# Patient Record
Sex: Female | Born: 1985
Health system: Southern US, Community
[De-identification: ages and names within clinical notes are randomized; demographics above are authoritative.]

## PROBLEM LIST (undated history)

## (undated) DIAGNOSIS — G43909 Migraine, unspecified, not intractable, without status migrainosus: Secondary | ICD-10-CM

## (undated) HISTORY — DX: Migraine, unspecified, not intractable, without status migrainosus: G43.909

## (undated) HISTORY — PX: WISDOM TOOTH EXTRACTION: SHX21

---
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
## 2010-01-25 ENCOUNTER — Inpatient Hospital Stay (HOSPITAL_COMMUNITY): Admission: AD | Admit: 2010-01-25 | Discharge: 2010-01-26 | Payer: Self-pay | Admitting: Obstetrics and Gynecology

## 2010-09-27 ENCOUNTER — Inpatient Hospital Stay (HOSPITAL_COMMUNITY): Admission: AD | Admit: 2010-09-27 | Discharge: 2010-09-30 | Payer: Self-pay | Admitting: Obstetrics and Gynecology

## 2011-01-28 LAB — CBC
HCT: 27.5 % — ABNORMAL LOW (ref 36.0–46.0)
HCT: 36.2 % (ref 36.0–46.0)
Hemoglobin: 12.3 g/dL (ref 12.0–15.0)
Hemoglobin: 9.5 g/dL — ABNORMAL LOW (ref 12.0–15.0)
MCH: 31.9 pg (ref 26.0–34.0)
MCH: 32.4 pg (ref 26.0–34.0)
MCHC: 33.9 g/dL (ref 30.0–36.0)
MCHC: 34.6 g/dL (ref 30.0–36.0)
MCV: 93.6 fL (ref 78.0–100.0)
MCV: 94 fL (ref 78.0–100.0)
Platelets: 165 10*3/uL (ref 150–400)
Platelets: 216 10*3/uL (ref 150–400)
RBC: 2.93 MIL/uL — ABNORMAL LOW (ref 3.87–5.11)
RBC: 3.85 MIL/uL — ABNORMAL LOW (ref 3.87–5.11)
RDW: 13.6 % (ref 11.5–15.5)
RDW: 14.1 % (ref 11.5–15.5)
WBC: 16.1 10*3/uL — ABNORMAL HIGH (ref 4.0–10.5)
WBC: 18.7 10*3/uL — ABNORMAL HIGH (ref 4.0–10.5)

## 2011-01-28 LAB — RPR: RPR Ser Ql: NONREACTIVE

## 2011-02-10 LAB — URINALYSIS, ROUTINE W REFLEX MICROSCOPIC
Glucose, UA: NEGATIVE mg/dL
Hgb urine dipstick: NEGATIVE
Ketones, ur: 15 mg/dL — AB
Protein, ur: NEGATIVE mg/dL

## 2011-07-02 ENCOUNTER — Ambulatory Visit (INDEPENDENT_AMBULATORY_CARE_PROVIDER_SITE_OTHER): Payer: 59

## 2011-07-02 ENCOUNTER — Inpatient Hospital Stay (INDEPENDENT_AMBULATORY_CARE_PROVIDER_SITE_OTHER)
Admission: RE | Admit: 2011-07-02 | Discharge: 2011-07-02 | Disposition: A | Discharge status: Home / Self Care | Payer: 59 | Source: Ambulatory Visit | Attending: Family Medicine | Admitting: Family Medicine

## 2011-07-02 DIAGNOSIS — J019 Acute sinusitis, unspecified: Secondary | ICD-10-CM

## 2011-09-01 ENCOUNTER — Inpatient Hospital Stay (INDEPENDENT_AMBULATORY_CARE_PROVIDER_SITE_OTHER)
Admission: RE | Admit: 2011-09-01 | Discharge: 2011-09-01 | Disposition: A | Discharge status: Home / Self Care | Payer: 59 | Source: Ambulatory Visit | Attending: Family Medicine | Admitting: Family Medicine

## 2011-09-01 DIAGNOSIS — J019 Acute sinusitis, unspecified: Secondary | ICD-10-CM

## 2011-11-13 IMAGING — US US OB COMP LESS 14 WK
1 series · 13 of 28 positions shown · non-contrast
Comparison: None

Clinical  Data: 24-year-old pregnant female with left pelvic  pain.
Estimated gestational age of 5 weeks 4 days by LMP.

OBSTETRIC <14 WK US AND TRANSVAGINAL OB US
TECHNIQUE: Both transabdominal and transvaginal ultrasound
examinations were performed for complete evaluation of the
gestation as well as the maternal uterus, adnexal regions, and
pelvic cul-de-sac.

[Series 1: us ob comp less 14 wks · 0.21mm/px · 13 of 35 slices shown]
[im 2/35]
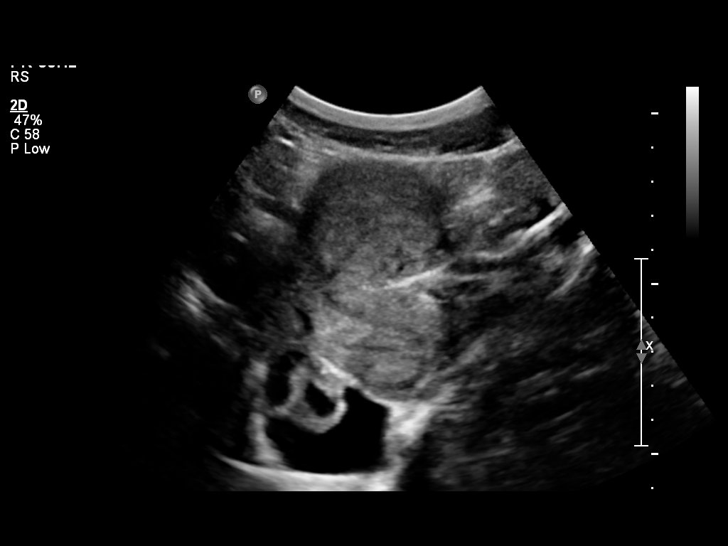
[im 4/35]
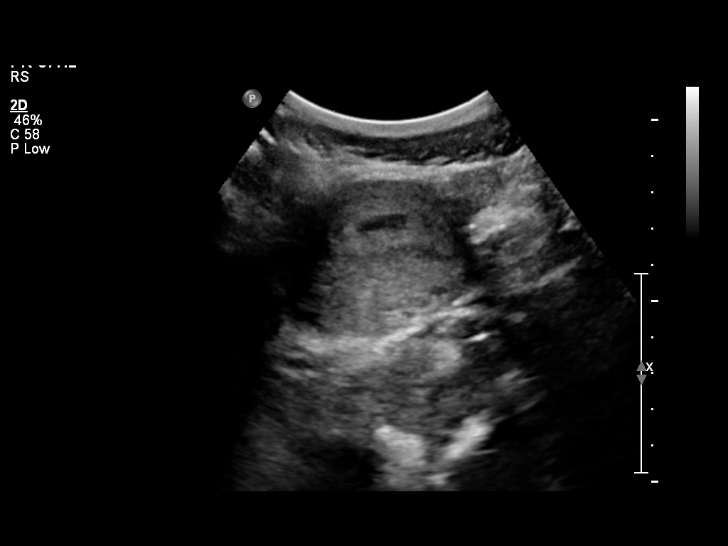
[im 7/35]
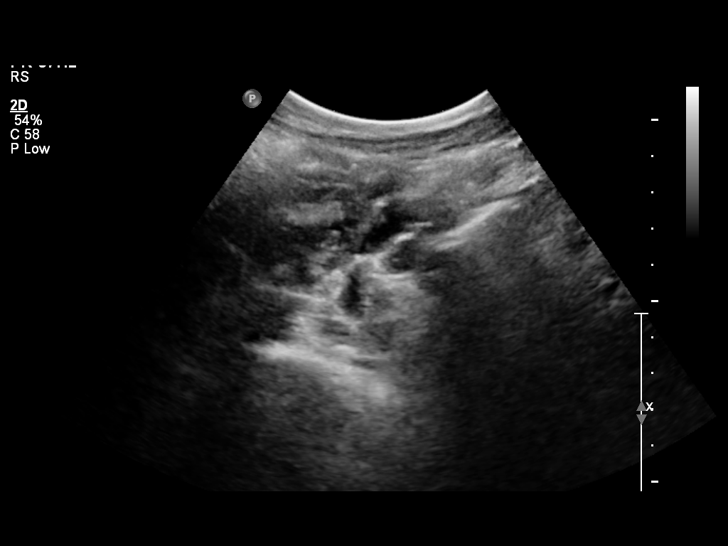
[im 9/35]
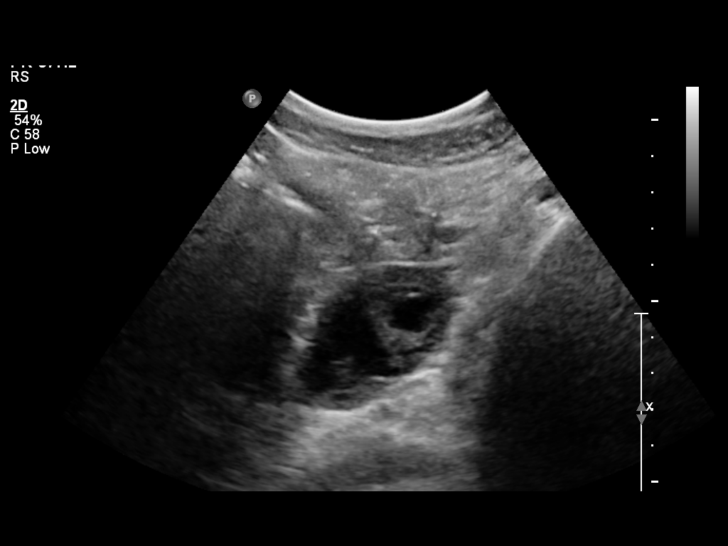
[im 12/35]
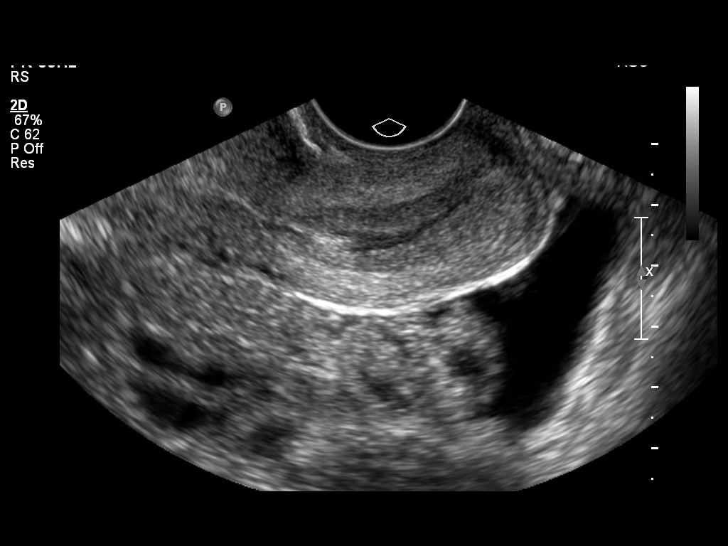
[im 14/35]
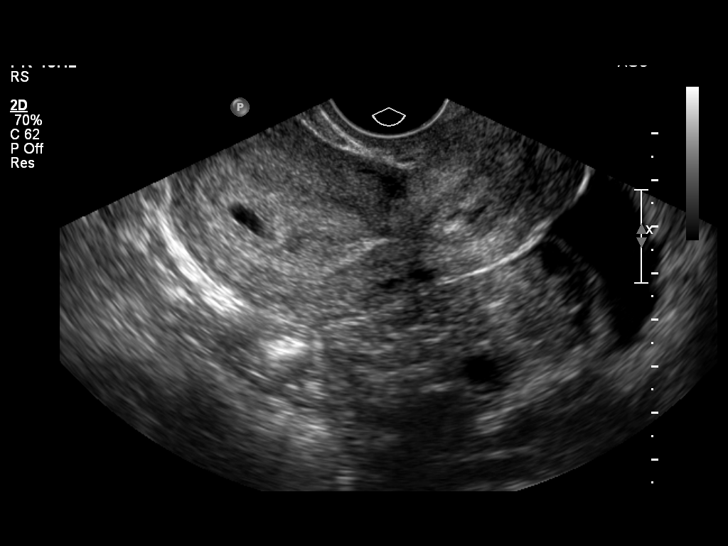
[im 18/35]
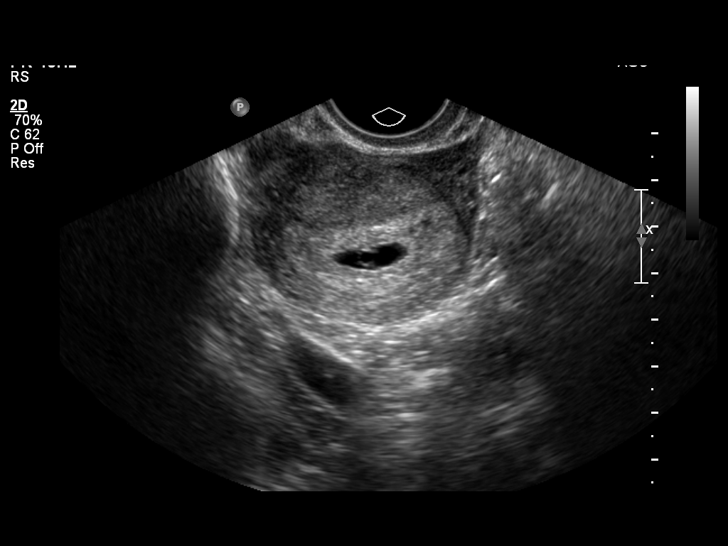
[im 21/35]
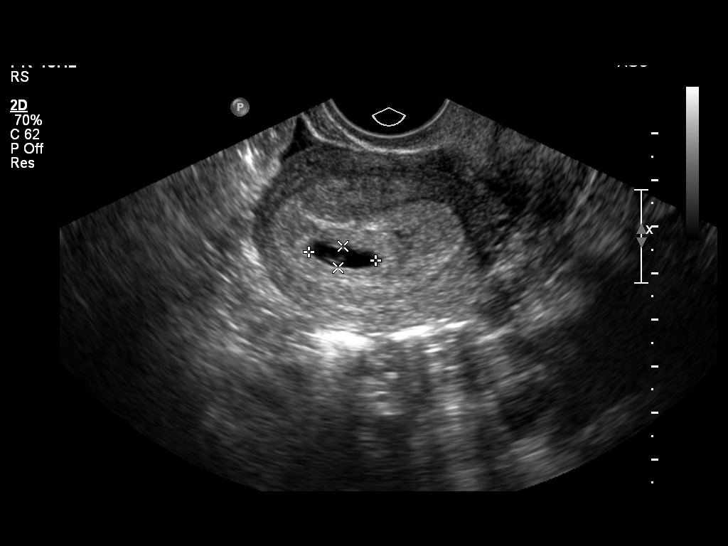
[im 23/35]
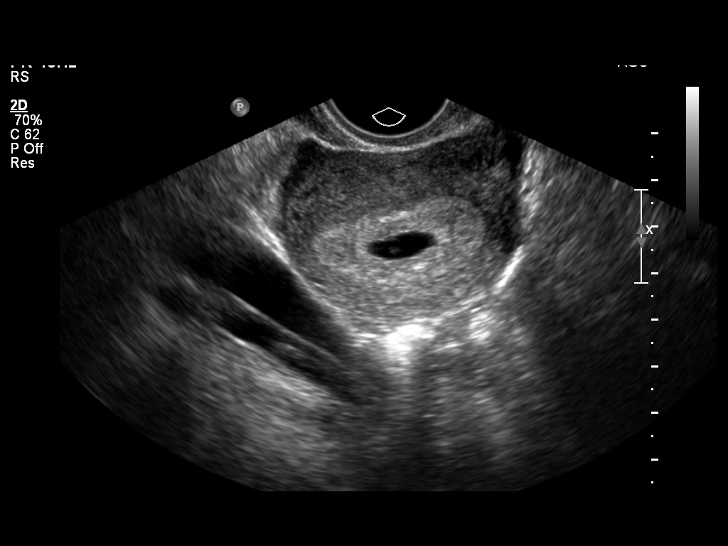
[im 26/35]
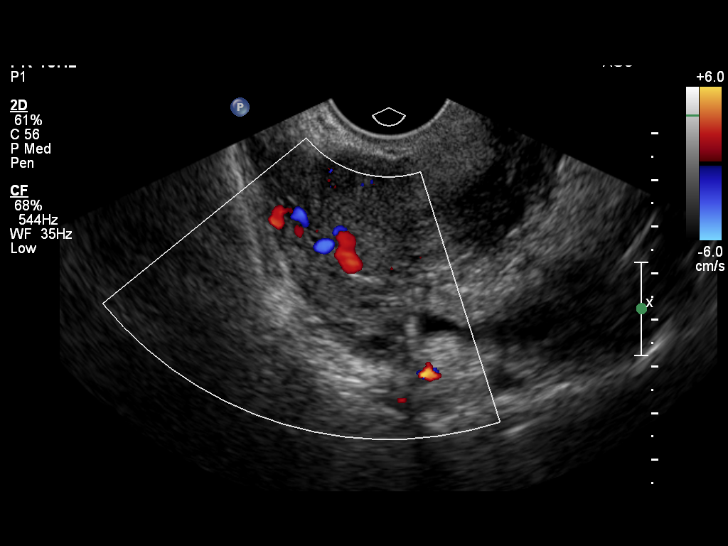
[im 28/35]
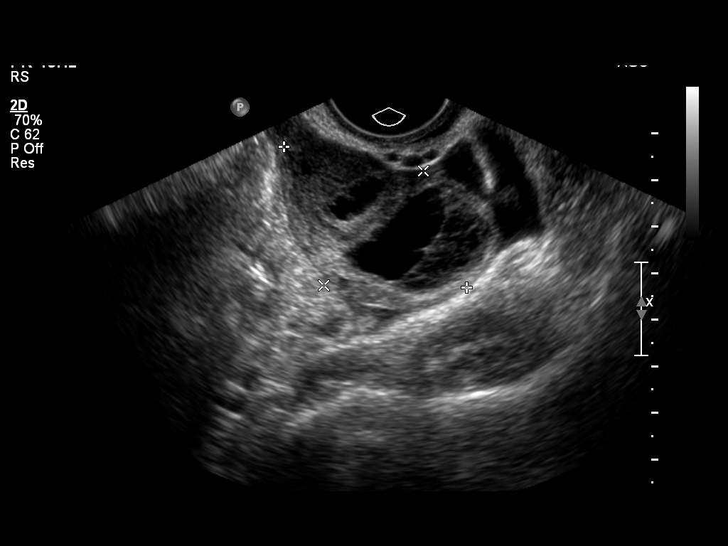
[im 31/35]
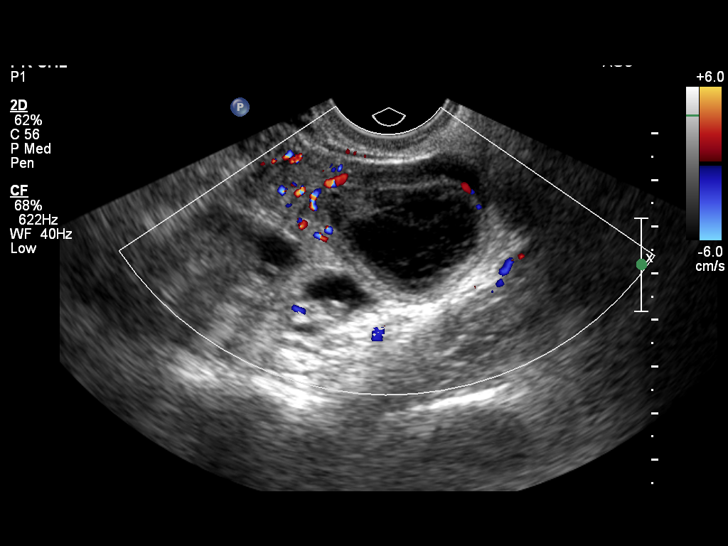
[im 33/35]
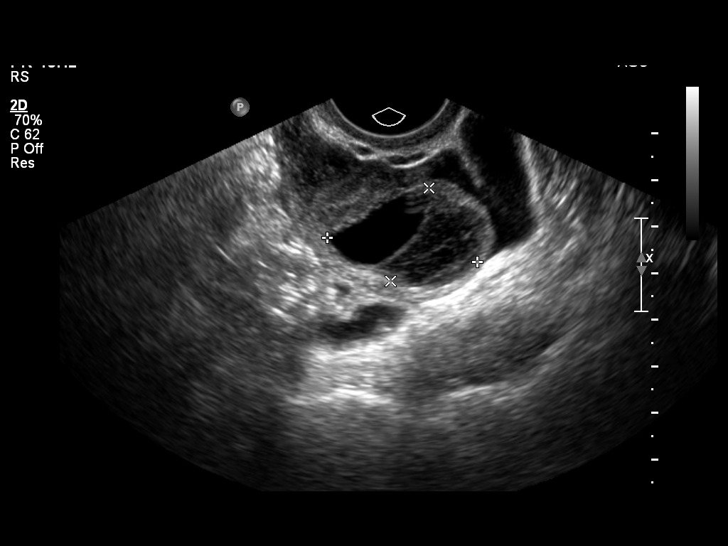

[13 of 28 positions shown; findings below may reference images not displayed]

FINDINGS: A single intrauterine gestational sac is identified containing a
yolk sac.
A fetal pole is not identified at this time.
Mean gestational sac diameter measures 10.9 mm corresponding to a
gestational age of 6 weeks 2 days.

There is no evidence of subchorionic hemorrhage.

The right ovary is unremarkable.
A 3.3 x 2.8 cm complex left ovarian cyst with low-level internal
echoes is identified.
A left corpus luteum cyst is also present.

A small amount of free fluid within the pelvis is noted.
There is no evidence of solid adnexal mass.
IMPRESSION: Single intrauterine gestational sac containing a yolk sac.
Estimated gestational age of 6 weeks 2 days by mean sac diameter.
Fetal pole is not identified at this time.

3.3 x 2.8 cm complex left ovarian cyst - likely hemorrhagic.

Small amount of free pelvic fluid.

## 2012-06-28 LAB — OB RESULTS CONSOLE GC/CHLAMYDIA: Chlamydia: NEGATIVE

## 2012-06-28 LAB — OB RESULTS CONSOLE HEPATITIS B SURFACE ANTIGEN: Hepatitis B Surface Ag: NEGATIVE

## 2012-06-28 LAB — OB RESULTS CONSOLE ABO/RH: RH Type: POSITIVE

## 2012-06-28 LAB — OB RESULTS CONSOLE RUBELLA ANTIBODY, IGM: Rubella: IMMUNE

## 2012-06-28 LAB — OB RESULTS CONSOLE RPR: RPR: NONREACTIVE

## 2012-11-17 NOTE — L&D Delivery Note (Signed)
Patient was C/C/+2 and pushed for 36 minutes with epidural.   NSVD female infant, Apgars 8/9, weight pending.   The patient had a superior periurethral tear repaired with 3-0 vicryl, patency of urethral confirmed with catheter, also a 1st degree perineal laceration repaired with 2-0 vicryl. Fundus was firm. EBL was expected. Placenta was delivered intact. Vagina was clear.  Baby was vigorous to bedside.  Linda Graham

## 2012-12-18 ENCOUNTER — Encounter (HOSPITAL_COMMUNITY): Payer: Self-pay | Admitting: Anesthesiology

## 2012-12-18 ENCOUNTER — Encounter (HOSPITAL_COMMUNITY): Payer: Self-pay

## 2012-12-18 ENCOUNTER — Inpatient Hospital Stay (HOSPITAL_COMMUNITY): Payer: 59 | Admitting: Anesthesiology

## 2012-12-18 ENCOUNTER — Inpatient Hospital Stay (HOSPITAL_COMMUNITY)
Admission: AD | Admit: 2012-12-18 | Discharge: 2012-12-20 | DRG: 775 | Disposition: A | Discharge status: Home / Self Care | Payer: 59 | Source: Ambulatory Visit | Attending: Obstetrics and Gynecology | Admitting: Obstetrics and Gynecology

## 2012-12-18 LAB — CBC
HCT: 33 % — ABNORMAL LOW (ref 36.0–46.0)
MCH: 28.7 pg (ref 26.0–34.0)
MCHC: 33.6 g/dL (ref 30.0–36.0)
MCV: 85.3 fL (ref 78.0–100.0)
RDW: 13.3 % (ref 11.5–15.5)

## 2012-12-18 MED ORDER — LIDOCAINE HCL (PF) 1 % IJ SOLN
INTRAMUSCULAR | Status: DC | PRN
  Administered 2012-12-18 (×2): 5 mL

## 2012-12-18 MED ORDER — OXYTOCIN BOLUS FROM INFUSION
500.0000 mL | INTRAVENOUS | Status: DC
  Administered 2012-12-19: 500 mL via INTRAVENOUS

## 2012-12-18 MED ORDER — IBUPROFEN 600 MG PO TABS: 600.0000 mg | ORAL_TABLET | Freq: Four times a day (QID) | ORAL | Status: DC | PRN

## 2012-12-18 MED ORDER — OXYTOCIN 40 UNITS IN LACTATED RINGERS INFUSION - SIMPLE MED
62.5000 mL/h | INTRAVENOUS | Status: DC
  Filled 2012-12-18: qty 1000

## 2012-12-18 MED ORDER — DIPHENHYDRAMINE HCL 50 MG/ML IJ SOLN: 12.5000 mg | INTRAMUSCULAR | Status: DC | PRN

## 2012-12-18 MED ORDER — LACTATED RINGERS IV SOLN
500.0000 mL | Freq: Once | INTRAVENOUS | Status: AC
  Administered 2012-12-18: 500 mL via INTRAVENOUS

## 2012-12-18 MED ORDER — CITRIC ACID-SODIUM CITRATE 334-500 MG/5ML PO SOLN: 30.0000 mL | ORAL | Status: DC | PRN

## 2012-12-18 MED ORDER — LACTATED RINGERS IV SOLN: 500.0000 mL | INTRAVENOUS | Status: DC | PRN

## 2012-12-18 MED ORDER — PHENYLEPHRINE 40 MCG/ML (10ML) SYRINGE FOR IV PUSH (FOR BLOOD PRESSURE SUPPORT): 80.0000 ug | PREFILLED_SYRINGE | INTRAVENOUS | Status: DC | PRN

## 2012-12-18 MED ORDER — FENTANYL 2.5 MCG/ML BUPIVACAINE 1/10 % EPIDURAL INFUSION (WH - ANES)
14.0000 mL/h | INTRAMUSCULAR | Status: DC
  Administered 2012-12-18 – 2012-12-19 (×2): 14 mL/h via EPIDURAL
  Filled 2012-12-18 (×2): qty 125

## 2012-12-18 MED ORDER — OXYCODONE-ACETAMINOPHEN 5-325 MG PO TABS: 1.0000 | ORAL_TABLET | ORAL | Status: DC | PRN

## 2012-12-18 MED ORDER — ONDANSETRON HCL 4 MG/2ML IJ SOLN: 4.0000 mg | Freq: Four times a day (QID) | INTRAMUSCULAR | Status: DC | PRN

## 2012-12-18 MED ORDER — ACETAMINOPHEN 325 MG PO TABS: 650.0000 mg | ORAL_TABLET | ORAL | Status: DC | PRN

## 2012-12-18 MED ORDER — EPHEDRINE 5 MG/ML INJ: 10.0000 mg | INTRAVENOUS | Status: DC | PRN

## 2012-12-18 MED ORDER — LIDOCAINE HCL (PF) 1 % IJ SOLN
30.0000 mL | INTRAMUSCULAR | Status: DC | PRN
  Administered 2012-12-19: 30 mL via SUBCUTANEOUS
  Filled 2012-12-18: qty 30

## 2012-12-18 MED ORDER — LACTATED RINGERS IV SOLN
INTRAVENOUS | Status: DC
  Administered 2012-12-18 – 2012-12-19 (×2): via INTRAVENOUS

## 2012-12-18 MED ORDER — PHENYLEPHRINE 40 MCG/ML (10ML) SYRINGE FOR IV PUSH (FOR BLOOD PRESSURE SUPPORT)
80.0000 ug | PREFILLED_SYRINGE | INTRAVENOUS | Status: DC | PRN
  Filled 2012-12-18: qty 5

## 2012-12-18 MED ORDER — EPHEDRINE 5 MG/ML INJ
10.0000 mg | INTRAVENOUS | Status: DC | PRN
  Filled 2012-12-18: qty 4

## 2012-12-18 NOTE — Progress Notes (Signed)
Report called to [facility] in [facility]. To BS via w/c

## 2012-12-18 NOTE — Anesthesia Preprocedure Evaluation (Signed)

## 2012-12-18 NOTE — Anesthesia Procedure Notes (Signed)
Epidural Patient location during procedure: OB Start time: 12/18/2012 11:54 PM  Staffing Anesthesiologist: Angus Seller., Harrell Gave. Performed by: anesthesiologist   Preanesthetic Checklist Completed: patient identified, site marked, surgical consent, pre-op evaluation, timeout performed, IV checked, risks and benefits discussed and monitors and equipment checked  Epidural Patient position: sitting Prep: site prepped and draped and DuraPrep Patient monitoring: continuous pulse ox and blood pressure Approach: midline Injection technique: LOR air and LOR saline  Needle:  Needle type: Tuohy  Needle gauge: 17 G Needle length: 9 cm and 9 Needle insertion depth: 5 cm cm Catheter type: closed end flexible Catheter size: 19 Gauge Catheter at skin depth: 10 cm Test dose: negative  Assessment Events: blood not aspirated, injection not painful, no injection resistance, negative IV test and no paresthesia  Additional Notes Patient identified.  Risk benefits discussed including failed block, incomplete pain control, headache, nerve damage, paralysis, blood pressure changes, nausea, vomiting, reactions to medication both toxic or allergic, and postpartum back pain.  Patient expressed understanding and wished to proceed.  All questions were answered.  Sterile technique used throughout procedure and epidural site dressed with sterile barrier dressing. No paresthesia or other complications noted.The patient did not experience any signs of intravascular injection such as tinnitus or metallic taste in mouth nor signs of intrathecal spread such as rapid motor block. Please see nursing notes for vital signs.

## 2012-12-18 NOTE — MAU Note (Signed)
Contractions all evening, starting getting much worse around 10:00 p.m.

## 2012-12-19 ENCOUNTER — Encounter (HOSPITAL_COMMUNITY): Payer: Self-pay | Admitting: *Deleted

## 2012-12-19 MED ORDER — BUPIVACAINE HCL (PF) 0.25 % IJ SOLN
INTRAMUSCULAR | Status: DC | PRN
  Administered 2012-12-19 (×2): 5 mL

## 2012-12-19 MED ORDER — OXYCODONE-ACETAMINOPHEN 5-325 MG PO TABS: 1.0000 | ORAL_TABLET | ORAL | Status: DC | PRN

## 2012-12-19 MED ORDER — TETANUS-DIPHTH-ACELL PERTUSSIS 5-2.5-18.5 LF-MCG/0.5 IM SUSP: 0.5000 mL | Freq: Once | INTRAMUSCULAR | Status: DC

## 2012-12-19 MED ORDER — LANOLIN HYDROUS EX OINT: TOPICAL_OINTMENT | CUTANEOUS | Status: DC | PRN

## 2012-12-19 MED ORDER — SENNOSIDES-DOCUSATE SODIUM 8.6-50 MG PO TABS
2.0000 | ORAL_TABLET | Freq: Every day | ORAL | Status: DC
  Administered 2012-12-20: 2 via ORAL

## 2012-12-19 MED ORDER — BENZOCAINE-MENTHOL 20-0.5 % EX AERO
1.0000 "application " | INHALATION_SPRAY | CUTANEOUS | Status: DC | PRN
  Administered 2012-12-19: 1 via TOPICAL
  Filled 2012-12-19: qty 56

## 2012-12-19 MED ORDER — ONDANSETRON HCL 4 MG PO TABS: 4.0000 mg | ORAL_TABLET | ORAL | Status: DC | PRN

## 2012-12-19 MED ORDER — PRENATAL MULTIVITAMIN CH
1.0000 | ORAL_TABLET | Freq: Every day | ORAL | Status: DC
  Administered 2012-12-19 – 2012-12-20 (×2): 1 via ORAL
  Filled 2012-12-19 (×2): qty 1

## 2012-12-19 MED ORDER — ONDANSETRON HCL 4 MG/2ML IJ SOLN: 4.0000 mg | INTRAMUSCULAR | Status: DC | PRN

## 2012-12-19 MED ORDER — IBUPROFEN 600 MG PO TABS
600.0000 mg | ORAL_TABLET | Freq: Four times a day (QID) | ORAL | Status: DC
  Administered 2012-12-19 – 2012-12-20 (×6): 600 mg via ORAL
  Filled 2012-12-19 (×6): qty 1

## 2012-12-19 MED ORDER — DIPHENHYDRAMINE HCL 25 MG PO CAPS: 25.0000 mg | ORAL_CAPSULE | Freq: Four times a day (QID) | ORAL | Status: DC | PRN

## 2012-12-19 MED ORDER — DIBUCAINE 1 % RE OINT: 1.0000 "application " | TOPICAL_OINTMENT | RECTAL | Status: DC | PRN

## 2012-12-19 MED ORDER — WITCH HAZEL-GLYCERIN EX PADS
1.0000 "application " | MEDICATED_PAD | CUTANEOUS | Status: DC | PRN
  Administered 2012-12-19: 1 via TOPICAL

## 2012-12-19 MED ORDER — ZOLPIDEM TARTRATE 5 MG PO TABS: 5.0000 mg | ORAL_TABLET | Freq: Every evening | ORAL | Status: DC | PRN

## 2012-12-19 MED ORDER — SIMETHICONE 80 MG PO CHEW: 80.0000 mg | CHEWABLE_TABLET | ORAL | Status: DC | PRN

## 2012-12-19 NOTE — Anesthesia Postprocedure Evaluation (Signed)
Anesthesia Post Note  Patient: Linda Graham  Procedure(s) Performed: * No procedures listed *  Anesthesia type: Epidural  Patient location: Mother/Baby  Post pain: Pain level controlled  Post assessment: Post-op Vital signs reviewed  Last Vitals:  Filed Vitals:   12/19/12 1309  BP: 120/55  Pulse: 72  Temp: 36.6 C  Resp: 18    Post vital signs: Reviewed  Level of consciousness:alert  Complications: No apparent anesthesia complications

## 2012-12-19 NOTE — H&P (Signed)
27 y.o. [redacted]w[redacted]d  G2P1001 comes in c/o ctx.  Otherwise has good fetal movement and no bleeding.  No past medical history on file.  Past Surgical History  Procedure Date  . Wisdom tooth extraction     OB History    Grav Para Term Preterm Abortions TAB SAB Ect Mult Living   2 1 1       1      # Outc Date GA Lbr Len/2nd Wgt Sex Del Anes PTL Lv   1 TRM            2 CUR               History   Social History  . Marital Status: Married    Spouse Name: N/A    Number of Children: N/A  . Years of Education: N/A   Occupational History  . Not on file.   Social History Main Topics  . Smoking status: Never Smoker   . Smokeless tobacco: Never Used  . Alcohol Use: No  . Drug Use: No  . Sexually Active: Yes   Other Topics Concern  . Not on file   Social History Narrative  . No narrative on file   Review of patient's allergies indicates no known allergies.    Prenatal Transfer Tool  Maternal Diabetes: No Genetic Screening: Declined Fetal Ultrasounds or other Referrals:  Other: nl anatomy scan Maternal Substance Abuse:  No Significant Maternal Medications:  None Significant Maternal Lab Results: Lab values include: Group B Strep negative  Other PNC:    Filed Vitals:   12/19/12 0401  BP: 123/71  Pulse: 87  Temp:   Resp: 20     Lungs/Cor:  NAD Abdomen:  soft, gravid Ex:  no cords, erythema SVE:  5/90/-1 at admission, now 9.5/100/1 FHTs:  135, good STV, NST R Toco:  q2-3   A/P   Admit in labor  GBS neg  Epidural received  SROM for light mec  Routine care  Bath, Luther Parody

## 2012-12-20 LAB — CBC
HCT: 28 % — ABNORMAL LOW (ref 36.0–46.0)
MCHC: 33.2 g/dL (ref 30.0–36.0)
MCV: 87.5 fL (ref 78.0–100.0)
Platelets: 148 10*3/uL — ABNORMAL LOW (ref 150–400)
RDW: 13.7 % (ref 11.5–15.5)

## 2012-12-20 MED ORDER — ACETAMINOPHEN FOR CIRCUMCISION 160 MG/5 ML
40.0000 mg | ORAL | Status: DC | PRN
  Filled 2012-12-20: qty 2.5

## 2012-12-20 MED ORDER — SUCROSE 24% NICU/PEDS ORAL SOLUTION
0.5000 mL | OROMUCOSAL | Status: DC
  Filled 2012-12-20 (×2): qty 0.5

## 2012-12-20 MED ORDER — EPINEPHRINE TOPICAL FOR CIRCUMCISION 0.1 MG/ML
1.0000 [drp] | TOPICAL | Status: DC | PRN
  Filled 2012-12-20: qty 0.05

## 2012-12-20 MED ORDER — ACETAMINOPHEN FOR CIRCUMCISION 160 MG/5 ML
40.0000 mg | Freq: Once | ORAL | Status: DC
  Filled 2012-12-20: qty 2.5

## 2012-12-20 MED ORDER — LIDOCAINE 1%/NA BICARB 0.1 MEQ INJECTION
0.8000 mL | INJECTION | Freq: Once | INTRAVENOUS | Status: DC
  Filled 2012-12-20: qty 1

## 2012-12-20 NOTE — Discharge Summary (Signed)
Obstetric Discharge Summary Reason for Admission: onset of labor Prenatal Procedures: none Intrapartum Procedures: spontaneous vaginal delivery Postpartum Procedures: none Complications-Operative and Postpartum: 1st degree perineal laceration, periurethral tear repaired Hemoglobin  Date Value Range Status  12/20/2012 9.3* 12.0 - 15.0 g/dL Final     HCT  Date Value Range Status  12/20/2012 28.0* 36.0 - 46.0 % Final    Physical Exam:  General: alert Lochia: appropriate Uterine Fundus: firm DVT Evaluation: No evidence of DVT seen on physical exam.  Discharge Diagnoses: Term Pregnancy-delivered  Discharge Information: Date: 12/20/2012 Activity: pelvic rest Diet: routine Medications: PNV and Ibuprofen Condition: stable Instructions: refer to practice specific booklet Discharge to: home Follow-up Information    Follow up with CALLAHAN, SIDNEY, DO. Schedule an appointment as soon as possible for a visit in 4 weeks.   Contact information:   [address] [place] [address] [phone]          Newborn Data: Live born female  Birth Weight: 9 lb 3.4 oz (4180 g) APGAR: 8, 9  Home with mother.  Mohammad Granade D 12/20/2012, 9:41 AM

## 2012-12-22 ENCOUNTER — Telehealth (HOSPITAL_COMMUNITY): Payer: Self-pay | Admitting: *Deleted

## 2012-12-22 NOTE — Telephone Encounter (Signed)
Resolve episode 

## 2012-12-23 NOTE — Progress Notes (Signed)
Post discharge chart review completed.  

## 2013-10-15 ENCOUNTER — Emergency Department (HOSPITAL_COMMUNITY)
Admission: EM | Admit: 2013-10-15 | Discharge: 2013-10-15 | Disposition: A | Discharge status: Home / Self Care | Payer: 59 | Source: Home / Self Care | Attending: Family Medicine | Admitting: Family Medicine

## 2013-10-15 ENCOUNTER — Encounter (HOSPITAL_COMMUNITY): Payer: Self-pay | Admitting: Emergency Medicine

## 2013-10-15 DIAGNOSIS — J329 Chronic sinusitis, unspecified: Secondary | ICD-10-CM

## 2013-10-15 MED ORDER — IPRATROPIUM BROMIDE 0.06 % NA SOLN: 2.0000 | Freq: Four times a day (QID) | NASAL | Status: DC

## 2013-10-15 MED ORDER — AMOXICILLIN 500 MG PO CAPS: 1000.0000 mg | ORAL_CAPSULE | Freq: Two times a day (BID) | ORAL | Status: DC

## 2013-10-15 NOTE — ED Provider Notes (Signed)
Linda Graham is a 27 y.o. female who presents to Urgent Care today for sinus pressure cough congestion sore throat and diarrhea. Patient developed sore throat and a few episodes of diarrhea 2 weeks ago. She improved and worse again yesterday. She notes some body aches. She denies any shortness of breath current nausea vomiting or diarrhea. She's tried Tylenol and ibuprofen which helped a bit. She denies any chest pain or palpitations and feels well otherwise.  She is currently breast-feeding   History reviewed. No pertinent past medical history. History  Substance Use Topics  . Smoking status: Never Smoker   . Smokeless tobacco: Never Used  . Alcohol Use: No   ROS as above Medications reviewed. No current facility-administered medications for this encounter.   Current Outpatient Prescriptions  Medication Sig Dispense Refill  . amoxicillin (AMOXIL) 500 MG capsule Take 2 capsules (1,000 mg total) by mouth 2 (two) times daily.  28 capsule  0  . ipratropium (ATROVENT) 0.06 % nasal spray Place 2 sprays into both nostrils 4 (four) times daily.  15 mL  1  . Prenatal Vit-Fe Fumarate-FA (PRENATAL MULTIVITAMIN) TABS Take 1 tablet by mouth daily.        Exam:  BP 119/80  Pulse 82  Temp(Src) 97.9 F (36.6 C) (Oral)  Resp 18  SpO2 99% Gen: Well NAD HEENT: EOMI,  MMM, normal-appearing tympanic membranes bilaterally. Posterior pharynx with cobblestoning. Mildly tender maxillary sinuses bilaterally. Clear nasal discharge. Lungs: Normal work of breathing. CTABL Heart: RRR no MRG Abd: NABS, Soft. NT, ND Exts: Non edematous BL  LE, warm and well perfused.    Assessment and Plan: 27 y.o. female with sinusitis. Likely viral. Plan to treat with Atrovent nasal spray, and Tylenol or ibuprofen. Additionally we'll use amoxicillin if patient is not improving in several days.  Followup with primary care provider.  Discussed warning signs or symptoms. Please see discharge instructions. Patient  expresses understanding.      Rodolph Bong, MD 10/15/13 [phone]

## 2013-10-15 NOTE — ED Notes (Signed)
Pt c/o cold sxs onset 2 weeks Sxs include: BA, fevers, chills, cough, congestion, facial pressure Denies: v/n/d Taking OTC cold meds.... Alert w/no signs of acute distress.

## 2014-09-18 ENCOUNTER — Encounter (HOSPITAL_COMMUNITY): Payer: Self-pay | Admitting: Emergency Medicine

## 2015-12-27 MED FILL — JOLESSA 0.15 MG-0.03 MG TAB: 0.15-0.03 | 90 days supply | Qty: 91 | Fill #0

## 2016-03-21 MED FILL — JOLESSA 0.15 MG-0.03 MG TAB: 0.15-0.03 | 90 days supply | Qty: 91 | Fill #1

## 2016-07-03 MED FILL — JOLESSA 0.15 MG-0.03 MG TAB: 0.15-0.03 | 90 days supply | Qty: 91 | Fill #2

## 2016-09-26 MED FILL — JOLESSA 0.15 MG-0.03 MG TAB: 0.15-0.03 | 90 days supply | Qty: 91 | Fill #3

## 2016-11-25 DIAGNOSIS — Z6821 Body mass index (BMI) 21.0-21.9, adult: Secondary | ICD-10-CM | POA: Diagnosis not present

## 2016-11-25 DIAGNOSIS — Z01419 Encounter for gynecological examination (general) (routine) without abnormal findings: Secondary | ICD-10-CM | POA: Diagnosis not present

## 2016-11-25 MED FILL — NUVARING VAGINAL RING: 0.12-0.015 | 84 days supply | Qty: 3 | Fill #0

## 2017-04-02 MED FILL — NUVARING VAGINAL RING: 0.12-0.015 | 84 days supply | Qty: 3 | Fill #1

## 2018-01-11 ENCOUNTER — Telehealth: Payer: 59 | Admitting: Family

## 2018-01-11 DIAGNOSIS — H5789 Other specified disorders of eye and adnexa: Secondary | ICD-10-CM

## 2018-01-11 DIAGNOSIS — H109 Unspecified conjunctivitis: Secondary | ICD-10-CM | POA: Diagnosis not present

## 2018-01-11 MED ORDER — POLYMYXIN B-TRIMETHOPRIM 10000-0.1 UNIT/ML-% OP SOLN: 2.0000 [drp] | Freq: Four times a day (QID) | OPHTHALMIC | 0 refills | Status: DC

## 2018-01-11 MED FILL — POLYMYXIN B/TMP EYE DROPS: 10000-0.1 | 12 days supply | Qty: 10 | Fill #0

## 2018-01-11 NOTE — Addendum Note (Signed)
Addended by: Beau FannyWITHROW, Analeise Mccleery C on: 01/11/2018 12:38 PM   Modules accepted: Orders

## 2018-01-11 NOTE — Progress Notes (Signed)
ADDENDUM: Pt added details about drainage/crusting. See new plan below.   We are sorry that you are not feeling well.  Here is how we plan to help!  Based on what you have shared with me it looks like you have conjunctivitis.  Conjunctivitis is a common inflammatory or infectious condition of the eye that is often referred to as "pink eye".  In most cases it is contagious (viral or bacterial). However, not all conjunctivitis requires antibiotics (ex. Allergic).  We have made appropriate suggestions for you based upon your presentation.  I have prescribed Polytrim Ophthalmic drops 2 drops 4 times a day times 5 days  Pink eye can be highly contagious.  It is typically spread through direct contact with secretions, or contaminated objects or surfaces that one may have touched.  Strict handwashing is suggested with soap and water is urged.  If not available, use alcohol based had sanitizer.  Avoid unnecessary touching of the eye.  If you wear contact lenses, you will need to refrain from wearing them until you see no white discharge from the eye for at least 24 hours after being on medication.  You should see symptom improvement in 1-2 days after starting the medication regimen.  Call us if symptoms are not improved in 1-2 days.  Home Care:  Wash your hands often!  Do not wear your contacts until you complete your treatment plan.  Avoid sharing towels, bed linen, personal items with a person who has pink eye.  See attention for anyone in your home with similar symptoms.  Get Help Right Away If:  Your symptoms do not improve.  You develop blurred or loss of vision.  Your symptoms worsen (increased discharge, pain or redness)  Your e-visit answers were reviewed by a board certified advanced clinical practitioner to complete your personal care plan.  Depending on the condition, your plan could have included both over the counter or prescription medications.  If there is a problem please reply   once you have received a response from your provider.  Your safety is important to us.  If you have drug allergies check your prescription carefully.    You can use MyChart to ask questions about today's visit, request a non-urgent call back, or ask for a work or school excuse for 24 hours related to this e-Visit. If it has been greater than 24 hours you will need to follow up with your provider, or enter a new e-Visit to address those concerns.   You will get an e-mail in the next two days asking about your experience.  I hope that your e-visit has been valuable and will speed your recovery. Thank you for using e-visits.

## 2018-01-11 NOTE — Progress Notes (Signed)
Thank you for the details you included in the comment boxes. Those details are very helpful in determining the best course of treatment for you and help us to provide the best care. Given that your eye is not draining or crusting or with pus at this point, antibiotics are not yet indicated. This could be a virus in the early stages or it is possible that you got a particle or substance in your eye. This often happens during sleep and can improve by flushing your eye out with over-the-counter eye drops such as Systane (or a pharmacy version of this). The template below mentions pink-eye and has other details. However, it does not appear that you have pink-eye based on the lack of drainage or crusting.   We are sorry that you are not feeling well.  Here is how we plan to help!  Based on what you have shared with me it looks like you have conjunctivitis.  Conjunctivitis is a common inflammatory or infectious condition of the eye that is often referred to as "pink eye".  In most cases it is contagious (viral or bacterial). However, not all conjunctivitis requires antibiotics (ex. Allergic).  We have made appropriate suggestions for you based upon your presentation.  Pink eye can be highly contagious.  It is typically spread through direct contact with secretions, or contaminated objects or surfaces that one may have touched.  Strict handwashing is suggested with soap and water is urged.  If not available, use alcohol based had sanitizer.  Avoid unnecessary touching of the eye.  If you wear contact lenses, you will need to refrain from wearing them until you see no white discharge from the eye for at least 24 hours after being on medication.  You should see symptom improvement in 1-2 days after starting the medication regimen.  Call us if symptoms are not improved in 1-2 days.  Home Care:  Wash your hands often!  Do not wear your contacts until you complete your treatment plan.  Avoid sharing towels, bed  linen, personal items with a person who has pink eye.  See attention for anyone in your home with similar symptoms.  Get Help Right Away If:  Your symptoms do not improve.  You develop blurred or loss of vision.  Your symptoms worsen (increased discharge, pain or redness)  Your e-visit answers were reviewed by a board certified advanced clinical practitioner to complete your personal care plan.  Depending on the condition, your plan could have included both over the counter or prescription medications.  If there is a problem please reply  once you have received a response from your provider.  Your safety is important to us.  If you have drug allergies check your prescription carefully.    You can use MyChart to ask questions about today's visit, request a non-urgent call back, or ask for a work or school excuse for 24 hours related to this e-Visit. If it has been greater than 24 hours you will need to follow up with your provider, or enter a new e-Visit to address those concerns.   You will get an e-mail in the next two days asking about your experience.  I hope that your e-visit has been valuable and will speed your recovery. Thank you for using e-visits.

## 2018-02-18 ENCOUNTER — Other Ambulatory Visit: Payer: Self-pay

## 2018-02-18 ENCOUNTER — Encounter (HOSPITAL_COMMUNITY): Payer: Self-pay | Admitting: Emergency Medicine

## 2018-02-18 ENCOUNTER — Ambulatory Visit (HOSPITAL_COMMUNITY)
Admission: EM | Admit: 2018-02-18 | Discharge: 2018-02-18 | Disposition: A | Discharge status: Home / Self Care | Payer: 59 | Attending: Family Medicine | Admitting: Family Medicine

## 2018-02-18 DIAGNOSIS — J020 Streptococcal pharyngitis: Secondary | ICD-10-CM

## 2018-02-18 LAB — POCT RAPID STREP A: STREPTOCOCCUS, GROUP A SCREEN (DIRECT): POSITIVE — AB

## 2018-02-18 MED ORDER — AMOXICILLIN 500 MG PO CAPS: 500.0000 mg | ORAL_CAPSULE | Freq: Two times a day (BID) | ORAL | 0 refills | Status: AC

## 2018-02-18 MED FILL — AMOXICILLIN 500 MG CAPSULE: 500 | 10 days supply | Qty: 20 | Fill #0

## 2018-02-18 NOTE — ED Triage Notes (Signed)
Sore throat , body aches, chills, no fever.  Pain into both ears.  Today ais day three of feeling bad

## 2018-02-18 NOTE — Discharge Instructions (Signed)
You are positive for strep throat today. I have sent antibiotics to the pharmacy for you to complete. Continue with tylenol and/or ibuprofen. Considered contagious for 24 hours once antibiotics are started.  Push fluids to ensure adequate hydration and keep secretions thin.

## 2018-02-18 NOTE — ED Provider Notes (Signed)
[facility]    CSN: [id number] Arrival date & time: 02/18/18  1213     History   Chief Complaint Chief Complaint  Patient presents with  . Sore Throat    HPI Linda Graham is a 32 y.o. female.   Linda Graham presents with complaints of sore throat which started three days ago and has persisted. Without congestion. No specific fevers but has had chills. No specific known ill contacts. Has been taking tylenol, ibuprofen and loratidine which have not helped. Ibuprofen last was this morning. Felt like her neck was sore this morning even due to pain. Without cough. No skin rash. Has had mild nausea, takes zofran for this which helps. Without contributing medical history.   ROS per HPI.      History reviewed. No pertinent past medical history.  There are no active problems to display for this patient.   Past Surgical History:  Procedure Laterality Date  . WISDOM TOOTH EXTRACTION      OB History    Gravida  2   Para  2   Term  2   Preterm      AB      Living  2     SAB      TAB      Ectopic      Multiple      Live Births  1            Home Medications    Prior to Admission medications   Medication Sig Start Date End Date Taking? Authorizing Provider  acetaminophen (TYLENOL) 325 MG tablet Take 650 mg by mouth every 6 (six) hours as needed.   Yes [provider]  ibuprofen (ADVIL,MOTRIN) 200 MG tablet Take 200 mg by mouth every 6 (six) hours as needed.   Yes [provider]  loratadine (CLARITIN) 10 MG tablet Take 10 mg by mouth daily.   Yes [provider]  amoxicillin (AMOXIL) 500 MG capsule Take 1 capsule (500 mg total) by mouth 2 (two) times daily for 10 days. 02/18/18 02/28/18  Georgetta HaberBurky, Natalie B, NP    Family History Family History  Problem Relation Age of Onset  . Diabetes Mother     Social History Social History   Tobacco Use  . Smoking status: Never Smoker  . Smokeless tobacco: Never Used    Substance Use Topics  . Alcohol use: No  . Drug use: No     Allergies   Patient has no known allergies.   Review of Systems Review of Systems   Physical Exam Triage Vital Signs ED Triage Vitals  Enc Vitals Group     BP 02/18/18 1242 129/79     Pulse Rate 02/18/18 1242 93     Resp 02/18/18 1242 18     Temp 02/18/18 1242 98.5 F (36.9 C)     Temp Source 02/18/18 1242 Oral     SpO2 02/18/18 1242 100 %     Weight --      Height --      Head Circumference --      Peak Flow --      Pain Score 02/18/18 1239 2     Pain Loc --      Pain Edu? --      Excl. in GC? --    No data found.  Updated Vital Signs BP 129/79 (BP Location: Left Arm)   Pulse 93   Temp 98.5 F (36.9 C) (Oral)   Resp  18   LMP 01/29/2018   SpO2 100%   Visual Acuity Right Eye Distance:   Left Eye Distance:   Bilateral Distance:    Right Eye Near:   Left Eye Near:    Bilateral Near:     Physical Exam  Constitutional: She is oriented to person, place, and time. She appears well-developed and well-nourished. No distress.  HENT:  Head: Normocephalic and atraumatic.  Right Ear: Tympanic membrane, external ear and ear canal normal.  Left Ear: Tympanic membrane, external ear and ear canal normal.  Nose: Nose normal.  Mouth/Throat: Uvula is midline and mucous membranes are normal. Posterior oropharyngeal erythema present. Tonsils are 2+ on the right. Tonsils are 2+ on the left. No tonsillar exudate.  Petechia noted to uvula   Eyes: Pupils are equal, round, and reactive to light. Conjunctivae and EOM are normal.  Cardiovascular: Normal rate, regular rhythm and normal heart sounds.  Pulmonary/Chest: Effort normal and breath sounds normal.  Lymphadenopathy:    She has cervical adenopathy.  Neurological: She is alert and oriented to person, place, and time.  Skin: Skin is warm and dry.     UC Treatments / Results  Labs (all labs ordered are listed, but only abnormal results are displayed) Labs  Reviewed - No data to display  EKG None Radiology No results found.  Procedures Procedures (including critical care time)  Medications Ordered in UC Medications - No data to display   Initial Impression / Assessment and Plan / UC Course  I have reviewed the triage vital signs and the nursing notes.  Pertinent labs & imaging results that were available during my care of the patient were reviewed by me and considered in my medical decision making (see chart for details).     Positive rapid strep which remains consistent with history and physical. Complete course of antibiotics.  Course of illness discussed. Patient verbalized understanding and agreeable to plan.    Final Clinical Impressions(s) / UC Diagnoses   Final diagnoses:  Strep pharyngitis    ED Discharge Orders        Ordered    amoxicillin (AMOXIL) 500 MG capsule  2 times daily     02/18/18 1258       Controlled Substance Prescriptions [place] Controlled Substance Registry consulted? Not Applicable   Georgetta Haber, NP 02/18/18 [phone]

## 2018-03-23 DIAGNOSIS — J018 Other acute sinusitis: Secondary | ICD-10-CM | POA: Diagnosis not present

## 2018-03-23 MED FILL — MONTELUKAST SOD 10 MG TAB: 10 | 90 days supply | Qty: 90 | Fill #0

## 2018-08-06 MED FILL — MONTELUKAST SOD 10 MG TAB: 10 | 90 days supply | Qty: 90 | Fill #1

## 2018-10-12 ENCOUNTER — Telehealth: Payer: 59 | Admitting: Family Medicine

## 2018-10-12 DIAGNOSIS — Z20818 Contact with and (suspected) exposure to other bacterial communicable diseases: Secondary | ICD-10-CM | POA: Diagnosis not present

## 2018-10-12 DIAGNOSIS — R05 Cough: Secondary | ICD-10-CM | POA: Diagnosis not present

## 2018-10-12 DIAGNOSIS — R059 Cough, unspecified: Secondary | ICD-10-CM

## 2018-10-12 MED ORDER — AMOXICILLIN 875 MG PO TABS: 875.0000 mg | ORAL_TABLET | Freq: Two times a day (BID) | ORAL | 0 refills | Status: DC

## 2018-10-12 MED ORDER — BENZONATATE 100 MG PO CAPS: 100.0000 mg | ORAL_CAPSULE | Freq: Three times a day (TID) | ORAL | 0 refills | Status: DC | PRN

## 2018-10-12 MED ORDER — PREDNISONE 10 MG (21) PO TBPK: ORAL_TABLET | ORAL | 0 refills | Status: DC

## 2018-10-12 MED FILL — AMOXICILLIN 875 MG TABLET: 875 | 10 days supply | Qty: 20 | Fill #0

## 2018-10-12 MED FILL — predniSONE 10 MG TABS: 10 | 6 days supply | Qty: 21 | Fill #0

## 2018-10-12 MED FILL — BENZONATATE 100 MG CAPS: 100 | 7 days supply | Qty: 20 | Fill #0

## 2018-10-12 NOTE — Progress Notes (Signed)
We are sorry that you are not feeling well.  Here is how we plan to help!  Based on your presentation I believe you most likely have A cough due to a virus.  This is called viral bronchitis and is best treated by rest, plenty of fluids and control of the cough.  You may use Ibuprofen or Tylenol as directed to help your symptoms.     In addition you may use A prescription cough medication called Tessalon Perles 100mg . You may take 1-2 capsules every 8 hours as needed for your cough.  Prednisone 10 mg daily for 6 days (see taper instructions below)   Take 6 tablets on day 1 with breakfast and one less tablet each morning after with food until complete.   Amoxicillin 875 mg twice daily for 7 days (strep exposure- household contact- and potential strep)  From your responses in the eVisit questionnaire you describe inflammation in the upper respiratory tract which is causing a significant cough.  This is commonly called Bronchitis and has four common causes:    Allergies  Viral Infections  Acid Reflux  Bacterial Infection Allergies, viruses and acid reflux are treated by controlling symptoms or eliminating the cause. An example might be a cough caused by taking certain blood pressure medications. You stop the cough by changing the medication. Another example might be a cough caused by acid reflux. Controlling the reflux helps control the cough.  USE OF BRONCHODILATOR ("RESCUE") INHALERS: There is a risk from using your bronchodilator too frequently.  The risk is that over-reliance on a medication which only relaxes the muscles surrounding the breathing tubes can reduce the effectiveness of medications prescribed to reduce swelling and congestion of the tubes themselves.  Although you feel brief relief from the bronchodilator inhaler, your asthma may actually be worsening with the tubes becoming more swollen and filled with mucus.  This can delay other crucial treatments, such as oral steroid  medications. If you need to use a bronchodilator inhaler daily, several times per day, you should discuss this with your provider.  There are probably better treatments that could be used to keep your asthma under control.     HOME CARE . Only take medications as instructed by your medical team. . Complete the entire course of an antibiotic. . Drink plenty of fluids and get plenty of rest. . Avoid close contacts especially the very young and the elderly . Cover your mouth if you cough or cough into your sleeve. . Always remember to wash your hands . A steam or ultrasonic humidifier can help congestion.   GET HELP RIGHT AWAY IF: . You develop worsening fever. . You become short of breath . You cough up blood. . Your symptoms persist after you have completed your treatment plan MAKE SURE YOU   Understand these instructions.  Will watch your condition.  Will get help right away if you are not doing well or get worse.  Your e-visit answers were reviewed by a board certified advanced clinical practitioner to complete your personal care plan.  Depending on the condition, your plan could have included both over the counter or prescription medications. If there is a problem please reply  once you have received a response from your provider. Your safety is important to us.  If you have drug allergies check your prescription carefully.    You can use MyChart to ask questions about today's visit, request a non-urgent call back, or ask for a work or school excuse  for 24 hours related to this e-Visit. If it has been greater than 24 hours you will need to follow up with your provider, or enter a new e-Visit to address those concerns. You will get an e-mail in the next two days asking about your experience.  I hope that your e-visit has been valuable and will speed your recovery. Thank you for using e-visits.

## 2018-11-01 ENCOUNTER — Encounter: Payer: Self-pay | Admitting: Family

## 2018-11-01 ENCOUNTER — Telehealth: Payer: 59 | Admitting: Family

## 2018-11-01 DIAGNOSIS — Z20818 Contact with and (suspected) exposure to other bacterial communicable diseases: Secondary | ICD-10-CM

## 2018-11-01 MED ORDER — AMOXICILLIN 500 MG PO CAPS: 500.0000 mg | ORAL_CAPSULE | Freq: Two times a day (BID) | ORAL | 0 refills | Status: DC

## 2018-11-01 MED FILL — AMOXICILLIN 500 MG CAPSULE: 500 | 10 days supply | Qty: 20 | Fill #0

## 2018-11-01 NOTE — Progress Notes (Signed)
Thank you for the details you included in the comment boxes. Those details are very helpful in determining the best course of treatment for you and help us to provide the best care. Based on your chart history, you seem to have recurrent Strep. See plan below. If this does not improve in a few days, you should ideally be seen face to face.  We are sorry that you are not feeling well.  Here is how we plan to help!  Based on what you have shared with me it is likely that you have strep pharyngitis.  Strep pharyngitis is inflammation and infection in the back of the throat.  This is an infection cause by bacteria and is treated with antibiotics.  I have prescribed Amoxicillin 500mg  by mouth twice daily for 10 days. For throat pain, we recommend over the counter oral pain relief medications such as acetaminophen or aspirin, or anti-inflammatory medications such as ibuprofen or naproxen sodium. Topical treatments such as oral throat lozenges or sprays may be used as needed. Strep infections are not as easily transmitted as other respiratory infections, however we still recommend that you avoid close contact with loved ones, especially the very young and elderly.  Remember to wash your hands thoroughly throughout the day as this is the number one way to prevent the spread of infection and wipe down door knobs and counters with disinfectant.   Home Care:  Only take medications as instructed by your medical team.  Complete the entire course of an antibiotic.  Do not take these medications with alcohol.  A steam or ultrasonic humidifier can help congestion.  You can place a towel over your head and breathe in the steam from hot water coming from a faucet.  Avoid close contacts especially the very young and the elderly.  Cover your mouth when you cough or sneeze.  Always remember to wash your hands.  Get Help Right Away If:  You develop worsening fever or sinus pain.  You develop a severe head ache  or visual changes.  Your symptoms persist after you have completed your treatment plan.  Make sure you  Understand these instructions.  Will watch your condition.  Will get help right away if you are not doing well or get worse.  Your e-visit answers were reviewed by a board certified advanced clinical practitioner to complete your personal care plan.  Depending on the condition, your plan could have included both over the counter or prescription medications.  If there is a problem please reply  once you have received a response from your provider.  Your safety is important to us.  If you have drug allergies check your prescription carefully.    You can use MyChart to ask questions about today's visit, request a non-urgent call back, or ask for a work or school excuse for 24 hours related to this e-Visit. If it has been greater than 24 hours you will need to follow up with your provider, or enter a new e-Visit to address those concerns.  You will get an e-mail in the next two days asking about your experience.  I hope that your e-visit has been valuable and will speed your recovery. Thank you for using e-visits.

## 2018-12-21 DIAGNOSIS — J020 Streptococcal pharyngitis: Secondary | ICD-10-CM | POA: Diagnosis not present

## 2019-01-10 ENCOUNTER — Ambulatory Visit: Payer: Self-pay | Admitting: Family Medicine

## 2019-01-10 ENCOUNTER — Encounter: Payer: Self-pay | Admitting: Family Medicine

## 2019-01-10 VITALS — BP 120/78 | HR 100 | Temp 99.9°F | Resp 16 | Wt 146.2 lb

## 2019-01-10 DIAGNOSIS — R6889 Other general symptoms and signs: Secondary | ICD-10-CM

## 2019-01-10 DIAGNOSIS — J101 Influenza due to other identified influenza virus with other respiratory manifestations: Secondary | ICD-10-CM

## 2019-01-10 LAB — POCT INFLUENZA A/B
Influenza A, POC: POSITIVE — AB
Influenza B, POC: NEGATIVE

## 2019-01-10 MED ORDER — AZELASTINE HCL 0.1 % NA SOLN: 1.0000 | Freq: Two times a day (BID) | NASAL | 0 refills | Status: DC

## 2019-01-10 MED ORDER — OSELTAMIVIR PHOSPHATE 75 MG PO CAPS: 75.0000 mg | ORAL_CAPSULE | Freq: Two times a day (BID) | ORAL | 0 refills | Status: AC

## 2019-01-10 MED FILL — OSELTAMIVIR PHOSPHATE 75 MG: 75 | 5 days supply | Qty: 10 | Fill #0

## 2019-01-10 NOTE — Progress Notes (Signed)
Linda Graham is a 33 y.o. female who presents today with 2 days of abrupt onset fever, chills, body aches, headaches, nightsweats. She has attempted OTC mucinex-d and tylenol with some relief of symptoms. 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 Of note she reports that she has personally had strep in 3 times in the last 6 months (diagnosed and treated) and bronchitis last November. She reports she is otherwise healthy but does have some seasonal allergies.    Review of Systems  Constitutional: Positive for chills, fever and malaise/fatigue.  HENT: Positive for sore throat. Negative for congestion, ear discharge, ear pain and sinus pain.   Eyes: Negative.   Respiratory: Positive for cough. Negative for sputum production and shortness of breath.   Cardiovascular: Negative.  Negative for chest pain.  Gastrointestinal: Negative for abdominal pain, diarrhea, nausea and vomiting.  Genitourinary: Negative for dysuria, frequency, hematuria and urgency.  Musculoskeletal: Positive for myalgias.  Skin: Negative.   Neurological: Negative for headaches.  Endo/Heme/Allergies: Negative.   Psychiatric/Behavioral: Negative.     Linda Graham has a current medication list which includes the following prescription(s): ibuprofen, loratadine, pseudoephedrine-guaifenesin, acetaminophen, amoxicillin, benzonatate, and prednisone. Also has No Known Allergies.  Linda Graham  has no past medical history on file. Also  has a past surgical history that includes Wisdom tooth extraction.    O: Vitals:   01/10/19 1558  BP: 120/78  Pulse: 100  Resp: 16  Temp: 99.9 F (37.7 C)  SpO2: 98%     Physical Exam Vitals  signs (febrile, tachy) reviewed.  Constitutional:      General: She is not in acute distress.    Appearance: She is well-developed. She is not ill-appearing, toxic-appearing or diaphoretic.  HENT:     Head: Normocephalic.     Right Ear: Hearing, tympanic membrane, ear canal and external ear normal. No middle ear effusion. Tympanic membrane is not injected or erythematous.     Left Ear: Hearing, tympanic membrane, ear canal and external ear normal.  No middle ear effusion. Tympanic membrane is not injected or erythematous.     Nose: Congestion and rhinorrhea present.     Right Sinus: No maxillary sinus tenderness or frontal sinus tenderness.     Left Sinus: No maxillary sinus tenderness or frontal sinus tenderness.     Mouth/Throat:     Lips: Pink.     Mouth: Mucous membranes are moist.     Pharynx: Uvula midline. No pharyngeal swelling, oropharyngeal exudate, posterior oropharyngeal erythema or uvula swelling.  Eyes:     Extraocular Movements: Extraocular movements intact.  Neck:     Musculoskeletal: Normal range of motion and neck supple.  Cardiovascular:     Rate and Rhythm: Regular rhythm. Tachycardia present.     Pulses: Normal pulses.     Heart sounds: Normal heart sounds.  Pulmonary:     Effort: Pulmonary effort is normal.     Breath sounds: Normal breath sounds.  Abdominal:     General: Bowel sounds are normal.     Palpations: Abdomen is soft.  Musculoskeletal: Normal range of motion.  Lymphadenopathy:     Head:     Right side  of head: No submental or submandibular adenopathy.     Left side of head: No submental or submandibular adenopathy.     Cervical: No cervical adenopathy.  Neurological:     Mental Status: She is alert and oriented to person, place, and time.  Psychiatric:        Mood and Affect: Mood normal.        Behavior: Behavior is cooperative.    A: 1. Influenza A   2. Flu-like symptoms    P: 1. Influenza A Will treat with antiviral for + Influenza A  at patient request. Work note x 72 hours and supportive care treatment ordered and reviewed with patient. Overall well appearing, NAD. Discussed natural history of the disease and treatment options; including supportive care and antiviral therapy. No evidence of high risk for factors for serious influenza complications observed. Encouraged rest, hydration, and to continue OTC tylenol or ibuprofen as prescribed for fever. Educated on proper Special educational needs teacher. Recommended wearing a mask daily especially around other people. Remain out of school/work until afebrile for 24 hours w/o use of antipyretics. Educated on potential complications of the flu. Advised to follow up in clinic or with PCP if she develops any of these concerning symptoms or if current symptoms persist outside of discussed boundaries.  Meds ordered this encounter  Medications  . azelastine (ASTELIN) 0.1 % nasal spray    Sig: Place 1 spray into both nostrils 2 (two) times daily. Use in each nostril as directed    Dispense:  30 mL    Refill:  0    Order Specific Question:   Supervising Provider    Answer:   MILLER, BRIAN [[id number]]  . oseltamivir (TAMIFLU) 75 MG capsule    Sig: Take 1 capsule (75 mg total) by mouth 2 (two) times daily for 5 days.    Dispense:  10 capsule    Refill:  0    Order Specific Question:   Supervising Provider    Answer:   Eber Hong [[id number]]     2. Flu-like symptoms - POCT Influenza A/B Results for orders placed or performed in visit on 01/10/19 (from the past 24 hour(s))  POCT Influenza A/B     Status: Abnormal   Collection Time: 01/10/19  4:26 PM  Result Value Ref Range   Influenza A, POC Positive (A) Negative   Influenza B, POC Negative Negative   Other orders - Pseudoephedrine-guaiFENesin (MUCINEX D PO); Take by mouth.  Discussed with patient exam findings, suspected diagnosis etiology and  reviewed recommended treatment plan and follow up, including complications and indications for urgent medical  follow up and evaluation. Medications including use and indications reviewed with patient. Patient provided relevant patient education on diagnosis and/or relevant related condition that were discussed and reviewed with patient at discharge. Patient verbalized understanding of information provided and agrees with plan of care (POC), all questions answered.

## 2019-01-10 NOTE — Patient Instructions (Signed)

## 2019-01-12 ENCOUNTER — Telehealth: Payer: Self-pay

## 2019-01-12 NOTE — Telephone Encounter (Signed)
Called and spoke with pt regarding how she is feeling since her visit and pt states she is feeling better.

## 2019-02-07 DIAGNOSIS — H5213 Myopia, bilateral: Secondary | ICD-10-CM | POA: Diagnosis not present

## 2019-03-09 DIAGNOSIS — N6310 Unspecified lump in the right breast, unspecified quadrant: Secondary | ICD-10-CM | POA: Diagnosis not present

## 2019-03-10 ENCOUNTER — Other Ambulatory Visit: Payer: Self-pay | Admitting: Obstetrics and Gynecology

## 2019-03-10 DIAGNOSIS — N6310 Unspecified lump in the right breast, unspecified quadrant: Secondary | ICD-10-CM

## 2019-03-18 ENCOUNTER — Ambulatory Visit
Admission: RE | Admit: 2019-03-18 | Discharge: 2019-03-18 | Disposition: A | Discharge status: Home / Self Care | Payer: 59 | Source: Ambulatory Visit | Attending: Obstetrics and Gynecology | Admitting: Obstetrics and Gynecology

## 2019-03-18 ENCOUNTER — Other Ambulatory Visit: Payer: Self-pay

## 2019-03-18 DIAGNOSIS — N6310 Unspecified lump in the right breast, unspecified quadrant: Secondary | ICD-10-CM | POA: Diagnosis not present

## 2019-06-17 DIAGNOSIS — R000 Tachycardia, unspecified: Secondary | ICD-10-CM | POA: Diagnosis not present

## 2019-06-17 DIAGNOSIS — R4020 Unspecified coma: Secondary | ICD-10-CM | POA: Diagnosis not present

## 2019-06-17 DIAGNOSIS — R110 Nausea: Secondary | ICD-10-CM | POA: Diagnosis not present

## 2019-06-17 DIAGNOSIS — G43909 Migraine, unspecified, not intractable, without status migrainosus: Secondary | ICD-10-CM | POA: Diagnosis not present

## 2019-06-17 DIAGNOSIS — R531 Weakness: Secondary | ICD-10-CM | POA: Diagnosis not present

## 2019-06-17 DIAGNOSIS — R51 Headache: Secondary | ICD-10-CM | POA: Diagnosis not present

## 2019-06-17 DIAGNOSIS — R55 Syncope and collapse: Secondary | ICD-10-CM | POA: Diagnosis not present

## 2019-06-17 DIAGNOSIS — R112 Nausea with vomiting, unspecified: Secondary | ICD-10-CM | POA: Diagnosis not present

## 2019-09-01 DIAGNOSIS — J060 Acute laryngopharyngitis: Secondary | ICD-10-CM | POA: Diagnosis not present

## 2019-09-01 DIAGNOSIS — G43009 Migraine without aura, not intractable, without status migrainosus: Secondary | ICD-10-CM | POA: Diagnosis not present

## 2019-09-01 MED FILL — AMOXICILLIN 875 MG TABS: 875 | 10 days supply | Qty: 20 | Fill #0

## 2019-11-25 DIAGNOSIS — J018 Other acute sinusitis: Secondary | ICD-10-CM | POA: Diagnosis not present

## 2019-11-25 DIAGNOSIS — R000 Tachycardia, unspecified: Secondary | ICD-10-CM | POA: Diagnosis not present

## 2019-11-25 DIAGNOSIS — R030 Elevated blood-pressure reading, without diagnosis of hypertension: Secondary | ICD-10-CM | POA: Diagnosis not present

## 2019-11-25 MED FILL — AMOXICILLIN 875 MG TABS: 875 | 10 days supply | Qty: 20 | Fill #0

## 2021-01-03 IMAGING — MG DIGITAL DIAGNOSTIC BILATERAL MAMMOGRAM WITH TOMO AND CAD
6 of 12 series · 6 of 36 positions shown · non-contrast
Comparison: None

CLINICAL DATA: Patient presents for palpable abnormalities within
the right breast 7 o'clock and 9 o'clock position.

EXAM:
DIGITAL DIAGNOSTIC BILATERAL MAMMOGRAM WITH CAD AND TOMO
ULTRASOUND RIGHT BREAST

[R MLO synth-2D]
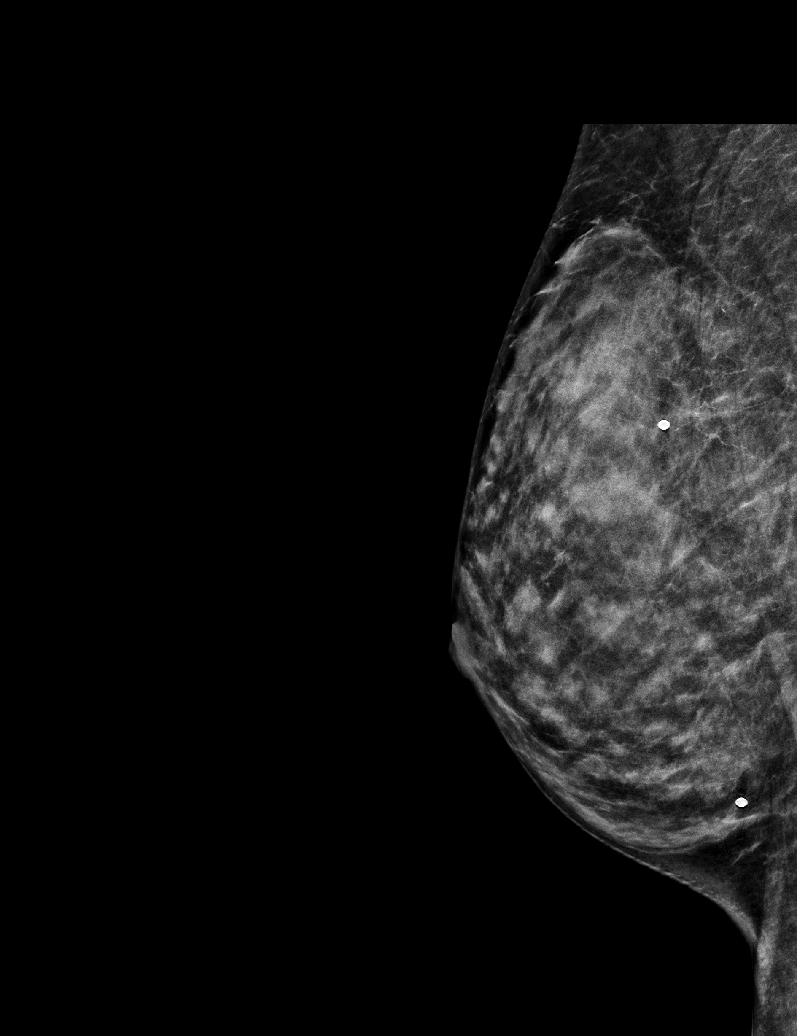

[R CC synth-2D]
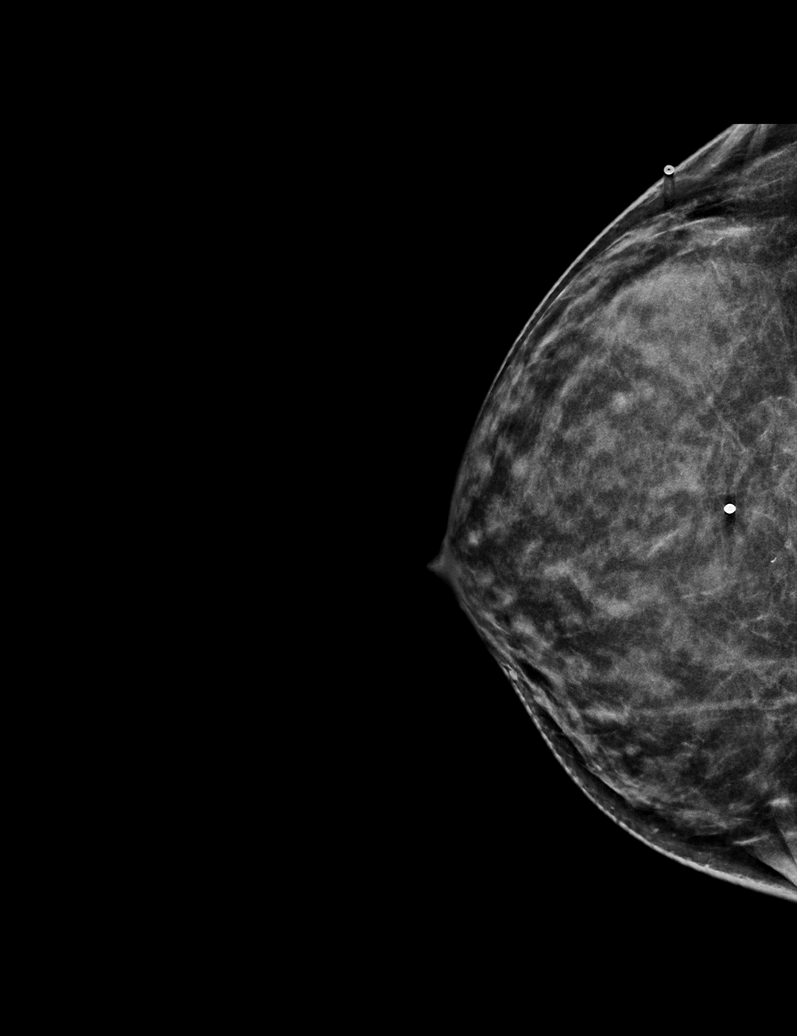

[R TAN synth-2D (1 of 2)]
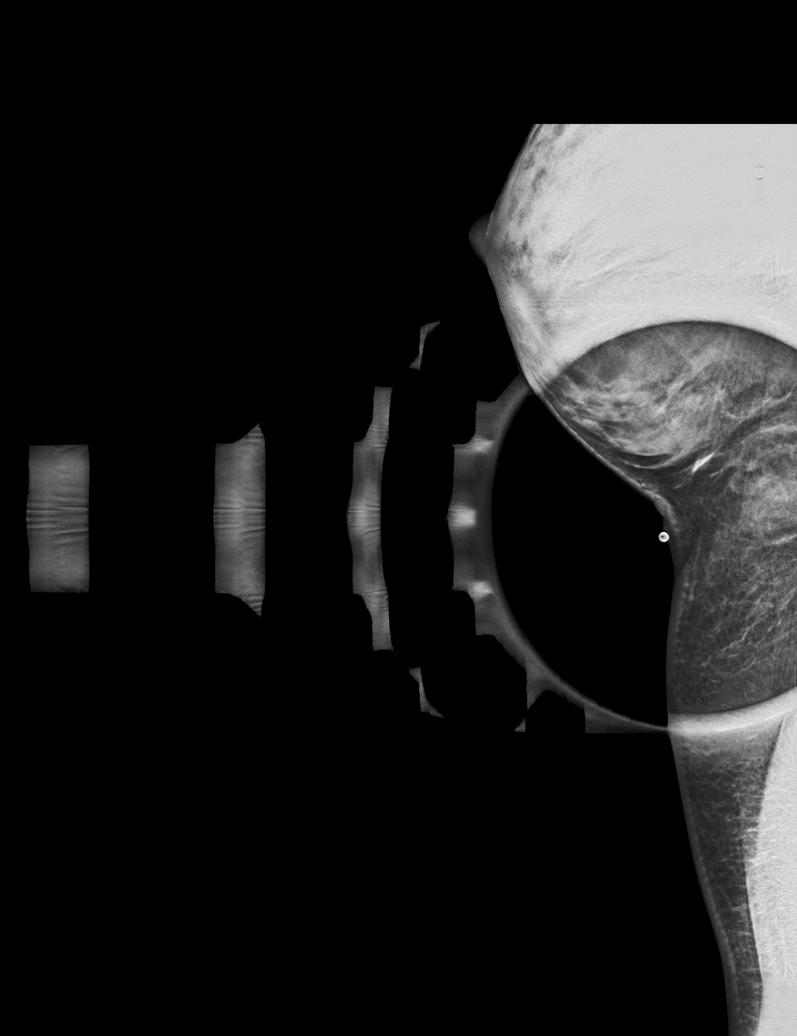

[L CC synth-2D]
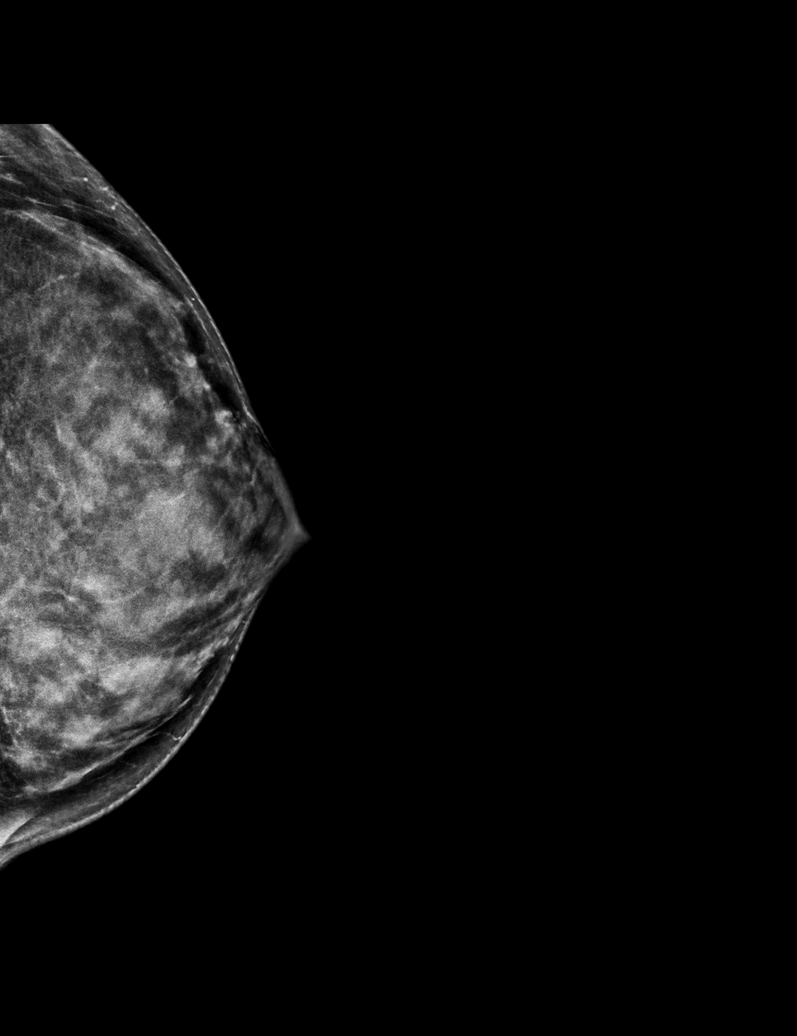

[R TAN synth-2D (2 of 2)]
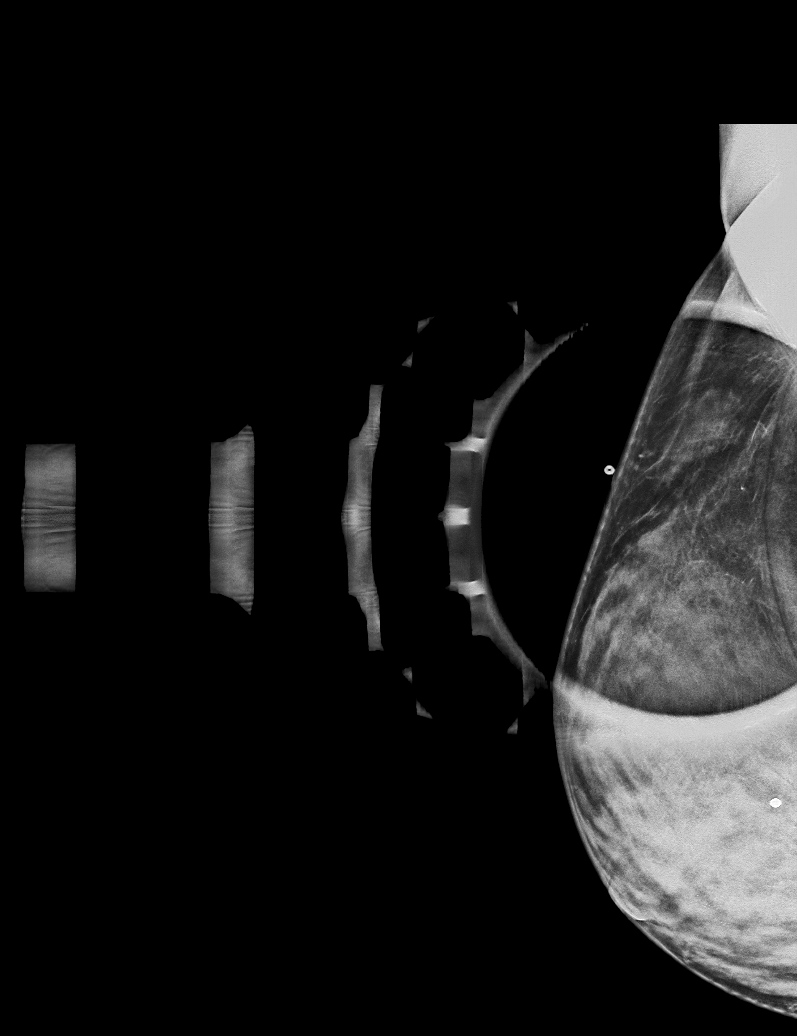

[L MLO synth-2D]
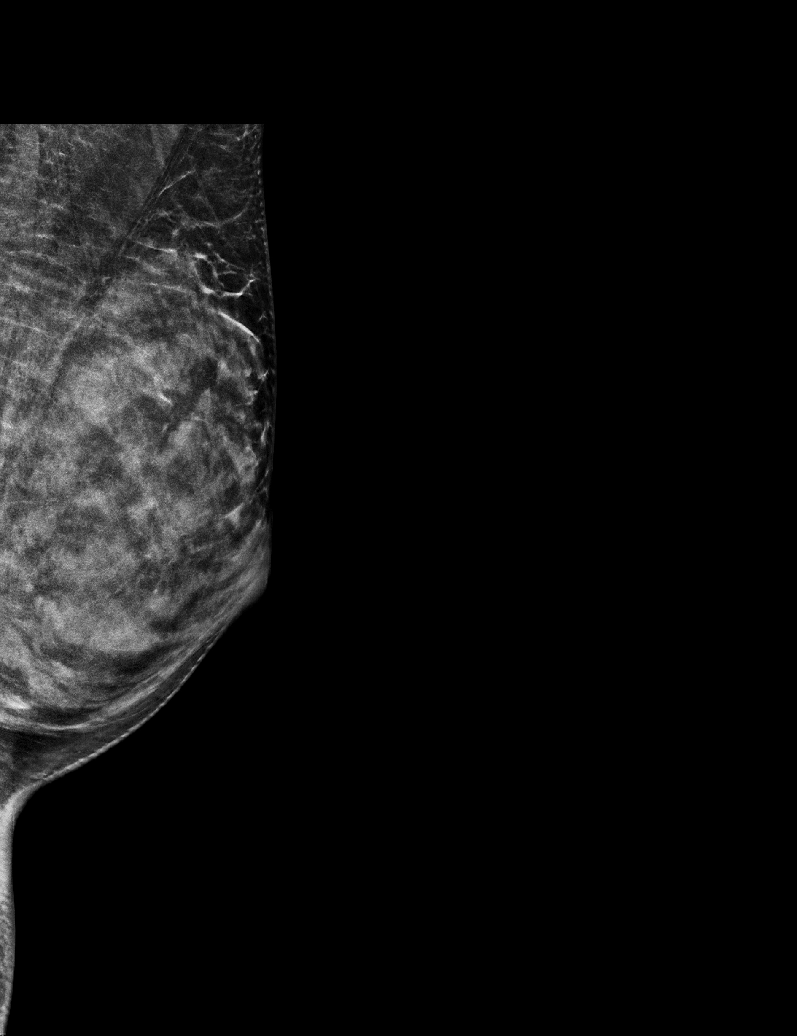

[6 of 36 positions shown; findings below may reference images not displayed]

ACR Breast Density Category d: The breast tissue is extremely dense,
which lowers the sensitivity of mammography.
FINDINGS: No concerning masses, calcifications or nonsurgical distortion
identified within either breast.

Mammographic images were processed with CAD.

On physical exam, a thin band of dense tissue is palpated within the
right breast 7 o'clock position.

No discrete mass is palpated within the 9 o'clock position right
breast.

Targeted ultrasound is performed, showing a thin band of dense
tissue within the right breast 7 o'clock position 8 cm from nipple
corresponding with the site of palpable concern.

Within the right breast 9 o'clock position 4 cm from nipple there is
dense tissue at the site of palpable concern.

No suspicious mass is identified at either site.
IMPRESSION: No mammographic evidence for malignancy.

RECOMMENDATION:
Continued clinical evaluation for right breast palpable
abnormalities.

Screening mammogram at age 39 unless there are persistent or
intervening clinical concerns. (Code:FU-R-FKG)

I have discussed the findings and recommendations with the patient.
Results were also provided in writing at the conclusion of the
visit. If applicable, a reminder letter will be sent to the patient
regarding the next appointment.

BI-RADS CATEGORY  1: Negative.

## 2021-04-12 ENCOUNTER — Other Ambulatory Visit (HOSPITAL_COMMUNITY): Payer: Self-pay

## 2021-04-12 MED ORDER — CARESTART COVID-19 HOME TEST VI KIT
PACK | 0 refills | Status: DC
  Filled 2021-04-12: qty 4, 4d supply, fill #0

## 2021-09-04 ENCOUNTER — Other Ambulatory Visit: Payer: Self-pay | Admitting: Family Medicine

## 2021-09-04 ENCOUNTER — Other Ambulatory Visit (HOSPITAL_COMMUNITY): Payer: Self-pay

## 2021-09-04 ENCOUNTER — Other Ambulatory Visit: Payer: Self-pay

## 2021-09-04 ENCOUNTER — Ambulatory Visit (HOSPITAL_COMMUNITY)
Admission: RE | Admit: 2021-09-04 | Discharge: 2021-09-04 | Disposition: A | Discharge status: Home / Self Care | Payer: 59 | Source: Ambulatory Visit | Attending: Family Medicine | Admitting: Family Medicine

## 2021-09-04 DIAGNOSIS — R059 Cough, unspecified: Secondary | ICD-10-CM | POA: Diagnosis not present

## 2021-09-04 DIAGNOSIS — R051 Acute cough: Secondary | ICD-10-CM | POA: Diagnosis not present

## 2021-09-04 DIAGNOSIS — J189 Pneumonia, unspecified organism: Secondary | ICD-10-CM

## 2021-09-04 MED ORDER — AZITHROMYCIN 250 MG PO TABS
ORAL_TABLET | ORAL | 0 refills | Status: DC
  Filled 2021-09-04: qty 6, 5d supply, fill #0

## 2021-09-04 MED ORDER — AMOXICILLIN 500 MG PO CAPS
ORAL_CAPSULE | ORAL | 0 refills | Status: DC
  Filled 2021-09-04: qty 30, 10d supply, fill #0

## 2021-09-04 NOTE — Progress Notes (Signed)
Patient's Chest Xray resulted with Pneumonia, Per Dr. Sedalia Muta patient needs repeat Chest Xray in 4 weeks. Putting in future order and patient will call back in 4 weeks for order. Patient is aware of these results and orders. LA

## 2021-09-24 ENCOUNTER — Other Ambulatory Visit (HOSPITAL_COMMUNITY): Payer: Self-pay

## 2021-09-24 MED ORDER — CARESTART COVID-19 HOME TEST VI KIT
PACK | 0 refills | Status: DC
  Filled 2021-09-24: qty 2, 2d supply, fill #0

## 2021-10-01 ENCOUNTER — Other Ambulatory Visit: Payer: Self-pay

## 2021-10-01 ENCOUNTER — Ambulatory Visit (HOSPITAL_COMMUNITY)
Admission: RE | Admit: 2021-10-01 | Discharge: 2021-10-01 | Disposition: A | Discharge status: Home / Self Care | Payer: 59 | Source: Ambulatory Visit | Attending: Family Medicine | Admitting: Family Medicine

## 2021-10-01 DIAGNOSIS — J189 Pneumonia, unspecified organism: Secondary | ICD-10-CM | POA: Diagnosis not present

## 2021-10-03 ENCOUNTER — Other Ambulatory Visit: Payer: Self-pay

## 2021-10-03 ENCOUNTER — Other Ambulatory Visit (HOSPITAL_COMMUNITY): Payer: Self-pay

## 2021-10-03 MED ORDER — MOXIFLOXACIN HCL 400 MG PO TABS
400.0000 mg | ORAL_TABLET | Freq: Every day | ORAL | 0 refills | Status: DC
  Filled 2021-10-03: qty 7, 7d supply, fill #0

## 2021-11-06 ENCOUNTER — Other Ambulatory Visit (HOSPITAL_COMMUNITY): Payer: Self-pay

## 2021-11-06 MED ORDER — CARESTART COVID-19 HOME TEST VI KIT
PACK | 0 refills | Status: DC
  Filled 2021-11-06: qty 4, 4d supply, fill #0

## 2021-11-14 ENCOUNTER — Other Ambulatory Visit (HOSPITAL_COMMUNITY): Payer: Self-pay

## 2021-12-18 DIAGNOSIS — Z01419 Encounter for gynecological examination (general) (routine) without abnormal findings: Secondary | ICD-10-CM | POA: Diagnosis not present

## 2021-12-18 DIAGNOSIS — Z124 Encounter for screening for malignant neoplasm of cervix: Secondary | ICD-10-CM | POA: Diagnosis not present

## 2021-12-18 LAB — RESULTS CONSOLE HPV: CHL HPV: NEGATIVE

## 2022-02-06 ENCOUNTER — Other Ambulatory Visit (HOSPITAL_COMMUNITY): Payer: Self-pay

## 2022-02-06 MED ORDER — CARESTART COVID-19 HOME TEST VI KIT
PACK | 0 refills | Status: DC
  Filled 2022-02-06: qty 2, 1d supply, fill #0

## 2022-07-10 ENCOUNTER — Encounter: Payer: Self-pay | Admitting: Family Medicine

## 2022-07-10 ENCOUNTER — Ambulatory Visit (INDEPENDENT_AMBULATORY_CARE_PROVIDER_SITE_OTHER): Payer: 59 | Admitting: Family Medicine

## 2022-07-10 ENCOUNTER — Other Ambulatory Visit (HOSPITAL_COMMUNITY): Payer: Self-pay

## 2022-07-10 VITALS — BP 122/68 | HR 74 | Resp 18 | Ht 69.0 in | Wt 146.6 lb

## 2022-07-10 DIAGNOSIS — F411 Generalized anxiety disorder: Secondary | ICD-10-CM | POA: Diagnosis not present

## 2022-07-10 DIAGNOSIS — L700 Acne vulgaris: Secondary | ICD-10-CM | POA: Diagnosis not present

## 2022-07-10 DIAGNOSIS — Z13220 Encounter for screening for lipoid disorders: Secondary | ICD-10-CM

## 2022-07-10 DIAGNOSIS — G43109 Migraine with aura, not intractable, without status migrainosus: Secondary | ICD-10-CM | POA: Diagnosis not present

## 2022-07-10 DIAGNOSIS — R002 Palpitations: Secondary | ICD-10-CM

## 2022-07-10 MED ORDER — DOXYCYCLINE HYCLATE 100 MG PO TABS
100.0000 mg | ORAL_TABLET | Freq: Two times a day (BID) | ORAL | 0 refills | Status: DC
  Filled 2022-07-10: qty 60, 30d supply, fill #0

## 2022-07-10 MED ORDER — NURTEC 75 MG PO TBDP
ORAL_TABLET | ORAL | 0 refills | Status: DC
  Filled 2022-07-10: qty 8, 30d supply, fill #0

## 2022-07-10 NOTE — Patient Instructions (Signed)
Kardia alivecor

## 2022-07-10 NOTE — Progress Notes (Signed)
Subjective:  Patient ID: Linda Graham, female    DOB: [birth year]  Age: 36 y.o. MRN: [id number]  Chief Complaint  Patient presents with   Nevus   Tachycardia    Has spells twice a week, mostly when working    HPI Patient has been seeing OB/GYN yearly but has not been seeing primary care. She would like referral to dermatology due to mole on face. She has also been experiencing tachycardia about two times a week, happens mostly when working. Sees heart rate around 115 during these spellings. She does feel like heart is racing and lightheadedness during these events. Episodes last about 15 minutes.  She is not currently taking any medications.  Not on birth control, LMP one week ago.  Patient has ocular migraines. Once a month. Vision disturbance and then headache.   No current outpatient medications on file prior to visit.   No current facility-administered medications on file prior to visit.   Past Medical History:  Diagnosis Date   Migraine    Past Surgical History:  Procedure Laterality Date   WISDOM TOOTH EXTRACTION      Family History  Problem Relation Age of Onset   Diabetes Mother    Pancreatic cancer Paternal Grandmother    Glaucoma Paternal Grandfather    Social History   Socioeconomic History   Marital status: Married    Spouse name: Not on file   Number of children: Not on file   Years of education: Not on file   Highest education level: Not on file  Occupational History   Not on file  Tobacco Use   Smoking status: Never   Smokeless tobacco: Never  Substance and Sexual Activity   Alcohol use: No   Drug use: No   Sexual activity: Yes    Birth control/protection: None  Other Topics Concern   Not on file  Social History Narrative   Not on file   Social Determinants of Health   Financial Resource Strain: Not on file  Food Insecurity: Not on file  Transportation Needs: Not on file  Physical Activity: Not on file  Stress: Not on file  Social  Connections: Not on file    Review of Systems  Constitutional:  Negative for appetite change, fatigue and fever.  HENT:  Negative for congestion, ear pain, sinus pressure and sore throat.   Respiratory:  Negative for cough, shortness of breath and wheezing.   Cardiovascular:  Negative for chest pain and palpitations.  Gastrointestinal:  Negative for abdominal pain, constipation, diarrhea, nausea and vomiting.  Genitourinary:  Negative for dysuria and frequency.  Musculoskeletal:  Negative for arthralgias, back pain, joint swelling and myalgias.  Skin:  Negative for rash.  Neurological:  Negative for dizziness, weakness and headaches.  Psychiatric/Behavioral:  Negative for dysphoric mood. The patient is not nervous/anxious.      Objective:  BP 122/68   Pulse 74   Resp 18   Ht 5\' 9"  (1.753 m)   Wt 146 lb 9.6 oz (66.5 kg)   LMP  (Within Days)   SpO2 99%   BMI 21.65 kg/m      07/10/2022    8:25 AM 01/10/2019    3:58 PM 02/18/2018   12:42 PM  BP/Weight  Systolic BP 122 120 129  Diastolic BP 68 78 79  Wt. (Lbs) 146.6 146.2   BMI 21.65 kg/m2 22.23 kg/m2     Physical Exam Vitals reviewed.  Constitutional:      Appearance: Normal appearance. She  is normal weight.  Neck:     Vascular: No carotid bruit.  Cardiovascular:     Rate and Rhythm: Normal rate and regular rhythm.     Heart sounds: Normal heart sounds.  Pulmonary:     Effort: Pulmonary effort is normal. No respiratory distress.     Breath sounds: Normal breath sounds.  Abdominal:     General: Abdomen is flat. Bowel sounds are normal.     Palpations: Abdomen is soft.     Tenderness: There is no abdominal tenderness.  Skin:    Findings: Lesion (nodular acne on chin and lower left cheek) present.  Neurological:     Mental Status: She is alert and oriented to person, place, and time.  Psychiatric:        Mood and Affect: Mood normal.        Behavior: Behavior normal.     Diabetic Foot Exam - Simple   No data  filed      Lab Results  Component Value Date   WBC 5.7 07/10/2022   HGB 13.2 07/10/2022   HCT 39.1 07/10/2022   PLT 292 07/10/2022   GLUCOSE 81 07/10/2022   CHOL 175 07/10/2022   TRIG 35 07/10/2022   HDL 82 07/10/2022   LDLCALC 85 07/10/2022   ALT 11 07/10/2022   AST 13 07/10/2022   NA 141 07/10/2022   K 4.3 07/10/2022   CL 102 07/10/2022   CREATININE 0.61 07/10/2022   BUN 10 07/10/2022   CO2 26 07/10/2022   TSH 1.870 07/10/2022      Assessment & Plan:   Problem List Items Addressed This Visit       Cardiovascular and Mediastinum   Ocular migraine    Samples of nurtec given.  Patient is having neurological migraines. Triptans contraindicated.       Relevant Medications   Rimegepant Sulfate (NURTEC) 75 MG TBDP     Musculoskeletal and Integument   Nodular acne    Rx: Doxycycline.      Relevant Medications   doxycycline (VIBRA-TABS) 100 MG tablet   Other Relevant Orders   Ambulatory referral to Dermatology     Other   Heart palpitations - Primary    Recommend Kardia alivecor      Relevant Orders   CBC with Differential/Platelet (Completed)   Comprehensive metabolic panel (Completed)   TSH (Completed)   Screening cholesterol level   Relevant Orders   Lipid panel (Completed)   GAD (generalized anxiety disorder)    Prefers no medicines.  Education given.      .  Meds ordered this encounter  Medications   doxycycline (VIBRA-TABS) 100 MG tablet    Sig: Take 1 tablet by mouth 2 times daily.    Dispense:  60 tablet    Refill:  0   Rimegepant Sulfate (NURTEC) 75 MG TBDP    Sig: Dissolve 1 tablet by mouth once at onset of migraine.    Dispense:  30 tablet    Refill:  0    Orders Placed This Encounter  Procedures   CBC with Differential/Platelet   Comprehensive metabolic panel   TSH   Lipid panel   Ambulatory referral to Dermatology     Follow-up: Return in about 1 year (around 07/11/2023).  An After Visit Summary was printed and given  to the patient.  Blane Ohara, MD [facility] [phone]

## 2022-07-11 LAB — COMPREHENSIVE METABOLIC PANEL
ALT: 11 IU/L (ref 0–32)
AST: 13 IU/L (ref 0–40)
Albumin/Globulin Ratio: 2.8 — ABNORMAL HIGH (ref 1.2–2.2)
Albumin: 5 g/dL — ABNORMAL HIGH (ref 3.9–4.9)
Alkaline Phosphatase: 49 IU/L (ref 44–121)
BUN/Creatinine Ratio: 16 (ref 9–23)
BUN: 10 mg/dL (ref 6–20)
Bilirubin Total: 0.4 mg/dL (ref 0.0–1.2)
CO2: 26 mmol/L (ref 20–29)
Calcium: 9.8 mg/dL (ref 8.7–10.2)
Chloride: 102 mmol/L (ref 96–106)
Creatinine, Ser: 0.61 mg/dL (ref 0.57–1.00)
Globulin, Total: 1.8 g/dL (ref 1.5–4.5)
Glucose: 81 mg/dL (ref 70–99)
Potassium: 4.3 mmol/L (ref 3.5–5.2)
Sodium: 141 mmol/L (ref 134–144)
Total Protein: 6.8 g/dL (ref 6.0–8.5)
eGFR: 118 mL/min/{1.73_m2} (ref >59)

## 2022-07-11 LAB — CBC WITH DIFFERENTIAL/PLATELET
Basophils Absolute: 0 10*3/uL (ref 0.0–0.2)
Basos: 1 %
EOS (ABSOLUTE): 0.1 10*3/uL (ref 0.0–0.4)
Eos: 2 %
Hematocrit: 39.1 % (ref 34.0–46.6)
Hemoglobin: 13.2 g/dL (ref 11.1–15.9)
Immature Grans (Abs): 0 10*3/uL (ref 0.0–0.1)
Immature Granulocytes: 0 %
Lymphocytes Absolute: 1.2 10*3/uL (ref 0.7–3.1)
Lymphs: 20 %
MCH: 30.1 pg (ref 26.6–33.0)
MCHC: 33.8 g/dL (ref 31.5–35.7)
MCV: 89 fL (ref 79–97)
Monocytes Absolute: 0.5 10*3/uL (ref 0.1–0.9)
Monocytes: 8 %
Neutrophils Absolute: 3.9 10*3/uL (ref 1.4–7.0)
Neutrophils: 69 %
Platelets: 292 10*3/uL (ref 150–450)
RBC: 4.39 x10E6/uL (ref 3.77–5.28)
RDW: 12.9 % (ref 11.7–15.4)
WBC: 5.7 10*3/uL (ref 3.4–10.8)

## 2022-07-11 LAB — LIPID PANEL
Chol/HDL Ratio: 2.1 ratio (ref 0.0–4.4)
Cholesterol, Total: 175 mg/dL (ref 100–199)
HDL: 82 mg/dL (ref >39)
LDL Chol Calc (NIH): 85 mg/dL (ref 0–99)
Triglycerides: 35 mg/dL (ref 0–149)
VLDL Cholesterol Cal: 8 mg/dL (ref 5–40)

## 2022-07-11 LAB — CARDIOVASCULAR RISK ASSESSMENT

## 2022-07-11 LAB — TSH: TSH: 1.87 u[IU]/mL (ref 0.450–4.500)

## 2022-07-13 ENCOUNTER — Encounter: Payer: Self-pay | Admitting: Family Medicine

## 2022-07-13 DIAGNOSIS — F411 Generalized anxiety disorder: Secondary | ICD-10-CM | POA: Insufficient documentation

## 2022-07-13 DIAGNOSIS — L700 Acne vulgaris: Secondary | ICD-10-CM | POA: Insufficient documentation

## 2022-07-13 DIAGNOSIS — R002 Palpitations: Secondary | ICD-10-CM | POA: Insufficient documentation

## 2022-07-13 DIAGNOSIS — G43109 Migraine with aura, not intractable, without status migrainosus: Secondary | ICD-10-CM | POA: Insufficient documentation

## 2022-07-13 DIAGNOSIS — Z13220 Encounter for screening for lipoid disorders: Secondary | ICD-10-CM | POA: Insufficient documentation

## 2022-07-13 NOTE — Assessment & Plan Note (Signed)
Rx Doxycycline

## 2022-07-13 NOTE — Assessment & Plan Note (Signed)
Samples of nurtec given.  Patient is having neurological migraines. Triptans contraindicated.

## 2022-07-13 NOTE — Assessment & Plan Note (Signed)
Prefers no medicines.  Education given.

## 2022-07-13 NOTE — Progress Notes (Signed)
Blood count normal.  Liver function normal.  Kidney function normal.  Thyroid function normal.  Cholesterol: normal.

## 2022-07-13 NOTE — Assessment & Plan Note (Signed)
Recommend Science writer

## 2022-07-17 NOTE — Addendum Note (Signed)
Addended by: Tawny Asal I on: 07/17/2022 12:05 PM   Modules accepted: Orders

## 2022-07-18 ENCOUNTER — Other Ambulatory Visit (HOSPITAL_COMMUNITY): Payer: Self-pay

## 2022-07-18 ENCOUNTER — Other Ambulatory Visit: Payer: Self-pay

## 2022-07-18 DIAGNOSIS — G43109 Migraine with aura, not intractable, without status migrainosus: Secondary | ICD-10-CM

## 2022-07-18 MED ORDER — NURTEC 75 MG PO TBDP
ORAL_TABLET | ORAL | 0 refills | Status: DC
  Filled 2022-07-18: qty 8, 30d supply, fill #0

## 2022-07-28 ENCOUNTER — Other Ambulatory Visit (HOSPITAL_COMMUNITY): Payer: Self-pay

## 2022-07-28 DIAGNOSIS — Z124 Encounter for screening for malignant neoplasm of cervix: Secondary | ICD-10-CM | POA: Diagnosis not present

## 2022-07-28 DIAGNOSIS — L292 Pruritus vulvae: Secondary | ICD-10-CM | POA: Diagnosis not present

## 2022-07-28 DIAGNOSIS — R87615 Unsatisfactory cytologic smear of cervix: Secondary | ICD-10-CM | POA: Diagnosis not present

## 2022-07-28 LAB — HM PAP SMEAR: HM Pap smear: NEGATIVE

## 2022-07-28 MED ORDER — CLOBETASOL PROPIONATE 0.05 % EX CREA
TOPICAL_CREAM | CUTANEOUS | 0 refills | Status: DC
  Filled 2022-07-28: qty 15, 7d supply, fill #0

## 2022-08-04 ENCOUNTER — Other Ambulatory Visit (HOSPITAL_COMMUNITY): Payer: Self-pay

## 2022-08-05 ENCOUNTER — Telehealth: Payer: Self-pay

## 2022-08-05 NOTE — Telephone Encounter (Signed)
PRIOR AUTHORIZATION WAS DONE THROUGH PATIENT'S INSURANCE FOR NURTEC AND PATIENT WAS APPROVED FOR 6 FILLS OF THE MEDICATION EFFECTIVE 08/05/22 TO 02/02/23.

## 2022-08-11 ENCOUNTER — Other Ambulatory Visit (HOSPITAL_COMMUNITY): Payer: Self-pay

## 2022-08-21 ENCOUNTER — Other Ambulatory Visit (HOSPITAL_COMMUNITY): Payer: Self-pay

## 2022-08-22 ENCOUNTER — Other Ambulatory Visit (HOSPITAL_COMMUNITY): Payer: Self-pay

## 2022-09-22 DIAGNOSIS — U071 COVID-19: Secondary | ICD-10-CM | POA: Insufficient documentation

## 2022-09-24 ENCOUNTER — Ambulatory Visit (INDEPENDENT_AMBULATORY_CARE_PROVIDER_SITE_OTHER): Payer: 59

## 2022-09-24 ENCOUNTER — Ambulatory Visit
Admission: RE | Admit: 2022-09-24 | Discharge: 2022-09-24 | Disposition: A | Discharge status: Home / Self Care | Payer: 59 | Source: Ambulatory Visit | Attending: Emergency Medicine | Admitting: Emergency Medicine

## 2022-09-24 VITALS — BP 129/62 | HR 87 | Temp 98.3°F | Resp 16

## 2022-09-24 DIAGNOSIS — R053 Chronic cough: Secondary | ICD-10-CM

## 2022-09-24 DIAGNOSIS — J329 Chronic sinusitis, unspecified: Secondary | ICD-10-CM | POA: Diagnosis not present

## 2022-09-24 DIAGNOSIS — U099 Post covid-19 condition, unspecified: Secondary | ICD-10-CM

## 2022-09-24 DIAGNOSIS — R0602 Shortness of breath: Secondary | ICD-10-CM | POA: Diagnosis not present

## 2022-09-24 DIAGNOSIS — R059 Cough, unspecified: Secondary | ICD-10-CM

## 2022-09-24 DIAGNOSIS — U071 COVID-19: Secondary | ICD-10-CM

## 2022-09-24 MED ORDER — IPRATROPIUM BROMIDE 0.06 % NA SOLN: 2.0000 | Freq: Three times a day (TID) | NASAL | 1 refills | Status: DC

## 2022-09-24 MED ORDER — DEXAMETHASONE 6 MG PO TABS: 6.0000 mg | ORAL_TABLET | Freq: Two times a day (BID) | ORAL | 0 refills | Status: AC

## 2022-09-24 MED ORDER — AZITHROMYCIN 250 MG PO TABS: ORAL_TABLET | ORAL | 0 refills | Status: AC

## 2022-09-24 MED ORDER — GUAIFENESIN 400 MG PO TABS: ORAL_TABLET | ORAL | 0 refills | Status: DC

## 2022-09-24 NOTE — ED Provider Notes (Signed)
[facility]    CSN: [id number] Arrival date & time: 09/24/22  1718    HISTORY   Chief Complaint  Patient presents with  . Cough    Tachycardia; worried about post covid pneumonia. Tested positive 10/31 - Entered by patient  . Tachycardia  . Shortness of Breath   HPI Linda Graham is a pleasant, 36 y.o. female who presents to urgent care today. Patient states that September 16, 2022 she both got her influenza vaccination and tested positive for COVID-19 with a home antigen test.  Patient states that since then she has been having chills, body aches and chest congestion.  Patient states she now has a cough productive of green mucus, has been experiencing episodes of tachycardia with a heart rate as high as 130.  Patient states she is unaware of whether or not she has had a fever.  Patient states that when she feels right now is how she felt when she had pneumonia last year.  Patient states she has been taking Motrin which is not helping.  On arrival today, patient has normal vital signs.    EMR reviewed, patient has a history of heart palpitations, last seen for this by her primary provider on July 10, 2022, CBC CMP and thyroid testing was all normal, cardia AliveCor, a home heart rhythm monitoring system was recommended.  Chest x-ray performed on September 04, 2021 revealed opacity in the posterior aspect of the left UPPER lobe consistent with pneumonia.  Repeat chest x-ray a month later on October 01, 2021 showed partial clearing of  focal infiltrate of the superior segment of the left LOWER lobe consistent with resolving pneumonia.      The history is provided by the patient.     Past Medical History:  Diagnosis Date  . Migraine    Patient Active Problem List   Diagnosis Date Noted  . Heart palpitations 07/13/2022  . Screening cholesterol level 07/13/2022  . Nodular acne 07/13/2022  . Ocular migraine 07/13/2022  . GAD (generalized anxiety disorder) 07/13/2022    Past Surgical History:  Procedure Laterality Date  . WISDOM TOOTH EXTRACTION     OB History     Gravida  2   Para  2   Term  2   Preterm      AB      Living  2      SAB      IAB      Ectopic      Multiple      Live Births  1          Home Medications    Prior to Admission medications   Medication Sig Start Date End Date Taking? Authorizing Provider  clobetasol cream (TEMOVATE) 0.05 % APPLY A THIN LAYER TO THE AFFECTED AREA(S)  2 TIMES PER DAY FOR UP TO 1 WEEK 07/28/22     doxycycline (VIBRA-TABS) 100 MG tablet Take 1 tablet by mouth 2 times daily. 07/10/22   [facility], Fritzi Mandes, MD  Rimegepant Sulfate (NURTEC) 75 MG TBDP Dissolve 1 tablet by mouth once at onset of migraine. 07/18/22   Blane Ohara, MD    Family History Family History  Problem Relation Age of Onset  . Diabetes Mother   . Pancreatic cancer Paternal Grandmother   . Glaucoma Paternal Grandfather    Social History Social History   Tobacco Use  . Smoking status: Never  . Smokeless tobacco: Never  Substance Use Topics  . Alcohol use: No  .  Drug use: No   Allergies   Patient has no known allergies.  Review of Systems Review of Systems Pertinent findings revealed after performing a 14 point review of systems has been noted in the history of present illness.  Physical Exam Triage Vital Signs ED Triage Vitals  Enc Vitals Group     BP 09/13/21 0827 (!) 147/82     Pulse Rate 09/13/21 0827 72     Resp 09/13/21 0827 18     Temp 09/13/21 0827 98.3 F (36.8 C)     Temp Source 09/13/21 0827 Oral     SpO2 09/13/21 0827 98 %     Weight --      Height --      Head Circumference --      Peak Flow --      Pain Score 09/13/21 0826 5     Pain Loc --      Pain Edu? --      Excl. in GC? --   No data found.  Updated Vital Signs BP 129/62 (BP Location: Right Arm)   Pulse 87   Temp 98.3 F (36.8 C) (Oral)   Resp 16   LMP 09/17/2022   SpO2 99%   Physical Exam  Visual Acuity Right Eye  Distance:   Left Eye Distance:   Bilateral Distance:    Right Eye Near:   Left Eye Near:    Bilateral Near:     UC Couse / Diagnostics / Procedures:     Radiology No results found.  Procedures Procedures (including critical care time) EKG  Pending results:  Labs Reviewed - No data to display  Medications Ordered in UC: Medications - No data to display  UC Diagnoses / Final Clinical Impressions(s)   I have reviewed the triage vital signs and the nursing notes.  Pertinent labs & imaging results that were available during my care of the patient were reviewed by me and considered in my medical decision making (see chart for details).    Final diagnoses:  None   ***  ED Prescriptions   None    PDMP not reviewed this encounter.  Disposition Upon Discharge:  Condition: stable for discharge home Home: take medications as prescribed; routine discharge instructions as discussed; follow up as advised.  Patient presented with an acute illness with associated systemic symptoms and significant discomfort requiring urgent management. In my opinion, this is a condition that a prudent lay person (someone who possesses an average knowledge of health and medicine) may potentially expect to result in complications if not addressed urgently such as respiratory distress, impairment of bodily function or dysfunction of bodily organs.   Routine symptom specific, illness specific and/or disease specific instructions were discussed with the patient and/or caregiver at length.   As such, the patient has been evaluated and assessed, work-up was performed and treatment was provided in alignment with urgent care protocols and evidence based medicine.  Patient/parent/caregiver has been advised that the patient may require follow up for further testing and treatment if the symptoms continue in spite of treatment, as clinically indicated and appropriate.  If the patient was tested for COVID-19,  Influenza and/or RSV, then the patient/parent/guardian was advised to isolate at home pending the results of his/her diagnostic coronavirus test and potentially longer if they're positive. I have also advised pt that if his/her COVID-19 test returns positive, it's recommended to self-isolate for at least 10 days after symptoms first appeared AND until fever-free for 24 hours without  fever reducer AND other symptoms have improved or resolved. Discussed self-isolation recommendations as well as instructions for household member/close contacts as per the [facility] and [place] DHHS, and also gave patient the COVID packet with this information.  Patient/parent/caregiver has been advised to return to the [facility] or PCP in 3-5 days if no better; to PCP or the Emergency Department if new signs and symptoms develop, or if the current signs or symptoms continue to change or worsen for further workup, evaluation and treatment as clinically indicated and appropriate  The patient will follow up with their current PCP if and as advised. If the patient does not currently have a PCP we will assist them in obtaining one.   The patient may need specialty follow up if the symptoms continue, in spite of conservative treatment and management, for further workup, evaluation, consultation and treatment as clinically indicated and appropriate.  Patient/parent/caregiver verbalized understanding and agreement of plan as discussed.  All questions were addressed during visit.  Please see discharge instructions below for further details of plan.  Discharge Instructions: Discharge Instructions   None     This office note has been dictated using Dragon speech recognition software.  Unfortunately, this method of dictation can sometimes lead to typographical or grammatical errors.  I apologize for your inconvenience in advance if this occurs.  Please do not hesitate to reach out to me if clarification is needed.

## 2022-09-24 NOTE — ED Triage Notes (Signed)
The patient states she got her flu shot on 09/16/22 and since has been feeling ill. The patient states she had chills, body aches, and congestion. The patient states she now has a productive cough (green mucus), tachycardia (90-130 BPM today) with SOB. The pt states she felt this way last year when she had pneumonia.   Tested positive for covid 09/16/22 with at home test.  Home interventions: Motrin

## 2022-09-24 NOTE — Discharge Instructions (Addendum)
Your chest x-ray is completely normal and not concerning the least for pneumonia.  Please read below to learn more about the medications, dosages and frequencies that I recommend to help alleviate your symptoms and to get you feeling better soon:   Z-Pak (azithromycin): This is an antibiotic that will eradicate any of the atypical bacteria that is lingering your lungs and causing inflammation to persist.  Please take 2 tablets the first day, then take 1 tablet daily every day thereafter until complete.      Decadron (dexamethasone): This is a steroid that will significantly calm your upper and lower airways, we know that it works uniquely well for inflammation caused by COVID-19, please take 1 tablet twice daily for 5 full days.      Atrovent (ipratropium): This is an excellent nasal decongestant spray that does not cause rebound congestion, please instill 2 sprays into each nare with each use.  This will help stop excessive mucus production and postnasal drip, which is probably one of the triggers of your cough at this time.  Please use the spray up to 4 times daily as needed.  I have provided you with a prescription for this medication.      Robitussin, Mucinex (guaifenesin): This is an expectorant.  This helps break up chest congestion and loosen up thick nasal drainage making phlegm and drainage more liquid and therefore easier to remove.  I recommend being 400 mg three times daily as needed.      Please follow-up within the next 5-7 days either with your primary care provider or urgent care if your symptoms do not resolve.  If you do not have a primary care provider, we will assist you in finding one.        Thank you for visiting urgent care today.  We appreciate the opportunity to participate in your care.

## 2023-01-26 ENCOUNTER — Telehealth: Payer: Self-pay

## 2023-01-26 NOTE — Telephone Encounter (Signed)
PA submitted and approved via covermymeds for nurtec. 

## 2023-07-12 NOTE — Progress Notes (Unsigned)
Subjective:  Patient ID: Linda Graham, female    DOB: 10-07-1986  Age: 37 y.o. MRN: [id number]  Chief Complaint  Patient presents with   Medical Management of Chronic Issues    HPI   Ocular migraine: Taking Nurtec 75 mg once at onset of migraine.     07/10/2022    8:41 AM  Depression screen PHQ 2/9  Decreased Interest 0  Down, Depressed, Hopeless 0  PHQ - 2 Score 0  Altered sleeping 0  Tired, decreased energy 1  Change in appetite 0  Feeling bad or failure about yourself  0  Trouble concentrating 1  Moving slowly or fidgety/restless 1  Suicidal thoughts 0  PHQ-9 Score 3  Difficult doing work/chores Not difficult at all         No data to display          Patient Care Team: Blane Ohara, MD as PCP - General (Internal Medicine)   Review of Systems  Current Outpatient Medications on File Prior to Visit  Medication Sig Dispense Refill   clobetasol cream (TEMOVATE) 0.05 % APPLY A THIN LAYER TO THE AFFECTED AREA(S)  2 TIMES PER DAY FOR UP TO 1 WEEK 15 g 0   guaifenesin (HUMIBID E) 400 MG TABS tablet Take 1 tablet 3 times daily as needed for chest congestion and cough 21 tablet 0   ipratropium (ATROVENT) 0.06 % nasal spray Place 2 sprays into both nostrils 3 (three) times daily. As needed for nasal congestion, runny nose 15 mL 1   Rimegepant Sulfate (NURTEC) 75 MG TBDP Dissolve 1 tablet by mouth once at onset of migraine. 14 tablet 0   No current facility-administered medications on file prior to visit.   Past Medical History:  Diagnosis Date   Migraine    Past Surgical History:  Procedure Laterality Date   WISDOM TOOTH EXTRACTION      Family History  Problem Relation Age of Onset   Diabetes Mother    Pancreatic cancer Paternal Grandmother    Glaucoma Paternal Grandfather    Social History   Socioeconomic History   Marital status: Married    Spouse name: Not on file   Number of children: Not on file   Years of education: Not on file   Highest  education level: Not on file  Occupational History   Not on file  Tobacco Use   Smoking status: Never   Smokeless tobacco: Never  Substance and Sexual Activity   Alcohol use: No   Drug use: No   Sexual activity: Yes    Birth control/protection: None  Other Topics Concern   Not on file  Social History Narrative   Not on file   Social Determinants of Health   Financial Resource Strain: Not on file  Food Insecurity: Not on file  Transportation Needs: Not on file  Physical Activity: Not on file  Stress: Not on file  Social Connections: Not on file    Objective:  There were no vitals taken for this visit.     09/24/2022    5:52 PM 07/10/2022    8:25 AM 01/10/2019    3:58 PM  BP/Weight  Systolic BP 129 122 120  Diastolic BP 62 68 78  Wt. (Lbs)  146.6 146.2  BMI  21.65 kg/m2 22.23 kg/m2    Physical Exam  Diabetic Foot Exam - Simple   No data filed      Lab Results  Component Value Date   WBC 5.7 07/10/2022  HGB 13.2 07/10/2022   HCT 39.1 07/10/2022   PLT 292 07/10/2022   GLUCOSE 81 07/10/2022   CHOL 175 07/10/2022   TRIG 35 07/10/2022   HDL 82 07/10/2022   LDLCALC 85 07/10/2022   ALT 11 07/10/2022   AST 13 07/10/2022   NA 141 07/10/2022   K 4.3 07/10/2022   CL 102 07/10/2022   CREATININE 0.61 07/10/2022   BUN 10 07/10/2022   CO2 26 07/10/2022   TSH 1.870 07/10/2022      Assessment & Plan:    Screening cholesterol level     No orders of the defined types were placed in this encounter.   No orders of the defined types were placed in this encounter.    Follow-up: No follow-ups on file.   I,Aranza Geddes I Leal-Borjas,acting as a scribe for Blane Ohara, MD.,have documented all relevant documentation on the behalf of Blane Ohara, MD,as directed by  Blane Ohara, MD while in the presence of Blane Ohara, MD.   An After Visit Summary was printed and given to the patient.  Blane Ohara, MD [facility] [phone]

## 2023-07-13 ENCOUNTER — Encounter: Payer: 59 | Admitting: Family Medicine

## 2023-07-13 DIAGNOSIS — Z13220 Encounter for screening for lipoid disorders: Secondary | ICD-10-CM

## 2023-07-18 NOTE — Progress Notes (Signed)
No show

## 2023-07-28 DIAGNOSIS — Z124 Encounter for screening for malignant neoplasm of cervix: Secondary | ICD-10-CM | POA: Diagnosis not present

## 2023-07-28 DIAGNOSIS — Z01419 Encounter for gynecological examination (general) (routine) without abnormal findings: Secondary | ICD-10-CM | POA: Diagnosis not present

## 2023-11-17 ENCOUNTER — Ambulatory Visit (INDEPENDENT_AMBULATORY_CARE_PROVIDER_SITE_OTHER): Payer: 59 | Admitting: Physician Assistant

## 2023-11-17 ENCOUNTER — Encounter: Payer: Self-pay | Admitting: Physician Assistant

## 2023-11-17 VITALS — BP 102/70 | HR 69 | Temp 98.2°F | Resp 16 | Ht 69.0 in | Wt 157.2 lb

## 2023-11-17 DIAGNOSIS — J060 Acute laryngopharyngitis: Secondary | ICD-10-CM

## 2023-11-17 MED ORDER — HYDROCODONE BIT-HOMATROP MBR 5-1.5 MG/5ML PO SOLN: 5.0000 mL | Freq: Four times a day (QID) | ORAL | 0 refills | Status: DC | PRN

## 2023-11-17 MED ORDER — AZITHROMYCIN 250 MG PO TABS: ORAL_TABLET | ORAL | 0 refills | Status: AC

## 2023-11-17 NOTE — Progress Notes (Signed)
   Acute Office Visit  Subjective:    Patient ID: Linda Graham, female    DOB: 22-Jun-1986, 37 y.o.   MRN: [id number]  Chief Complaint  Patient presents with   Sinusitis   Fatigue   Cough   Sore Throat    HPI: Patient is in today for complaints of sinus pressure, sore throat, pnd and cough - has had symptoms off and on for the past 2 weeks but worse in the past few days Denies fever --- family with similar symptoms Using mucinex  and nyquil   Current Outpatient Medications:    azithromycin  (ZITHROMAX ) 250 MG tablet, Take 2 tablets on day 1, then 1 tablet daily on days 2 through 5, Disp: 6 tablet, Rfl: 0   HYDROcodone  bit-homatropine (HYDROMET) 5-1.5 MG/5ML syrup, Take 5 mLs by mouth every 6 (six) hours as needed., Disp: 120 mL, Rfl: 0   Rimegepant Sulfate  (NURTEC) 75 MG TBDP, Dissolve 1 tablet by mouth once at onset of migraine., Disp: 14 tablet, Rfl: 0  No Known Allergies  ROS CONSTITUTIONAL: Negative for chills, fatigue, fever,  E/N/T: see HPI CARDIOVASCULAR: Negative for chest pain, dizziness, palpitations  RESPIRATORY: see HPI GASTROINTESTINAL: Negative for abdominal pain, acid reflux symptoms, constipation, diarrhea, nausea and vomiting.       Objective:    PHYSICAL EXAM:   BP 102/70 (BP Location: Right Arm, Patient Position: Sitting, Cuff Size: Normal)   Pulse 69   Temp 98.2 F (36.8 C) (Temporal)   Resp 16   Ht 5' 9 (1.753 m)   Wt 157 lb 3.2 oz (71.3 kg)   LMP 11/15/2023   SpO2 98%   BMI 23.21 kg/m    GEN: Well nourished, well developed, in no acute distress  HEENT: normal external ears and nose - normal external auditory canals and TMS - Oropharynx - erythema/pnd Cardiac: RRR; no murmurs,  Respiratory:  normal respiratory rate and pattern with no distress - normal breath sounds with no rales, rhonchi, wheezes or rubs      Assessment & Plan:    Acute laryngopharyngitis -     Azithromycin ; Take 2 tablets on day 1, then 1 tablet daily on days 2  through 5  Dispense: 6 tablet; Refill: 0 -     HYDROcodone  Bit-Homatrop MBr; Take 5 mLs by mouth every 6 (six) hours as needed.  Dispense: 120 mL; Refill: 0     Follow-up: Return if symptoms worsen or fail to improve.  An After Visit Summary was printed and given to the patient.  CAMIE JONELLE NICHOLAUS DEVONNA [facility] [phone]

## 2024-01-18 ENCOUNTER — Telehealth: Payer: Self-pay

## 2024-01-18 NOTE — Telephone Encounter (Signed)
 Patient has been approved Nurtec 75mg  03.02.2025 through 03.01.2026.

## 2024-04-13 ENCOUNTER — Encounter: Payer: Self-pay | Admitting: Family Medicine

## 2024-08-04 ENCOUNTER — Other Ambulatory Visit (HOSPITAL_COMMUNITY): Payer: Self-pay

## 2024-08-04 DIAGNOSIS — Z01419 Encounter for gynecological examination (general) (routine) without abnormal findings: Secondary | ICD-10-CM | POA: Diagnosis not present

## 2024-08-04 DIAGNOSIS — Z124 Encounter for screening for malignant neoplasm of cervix: Secondary | ICD-10-CM | POA: Diagnosis not present

## 2024-08-04 MED ORDER — TRANEXAMIC ACID 650 MG PO TABS
650.0000 mg | ORAL_TABLET | Freq: Three times a day (TID) | ORAL | 3 refills | Status: DC
  Filled 2024-08-04: qty 90, 90d supply, fill #0

## 2024-08-15 ENCOUNTER — Other Ambulatory Visit: Payer: Self-pay

## 2024-08-15 ENCOUNTER — Other Ambulatory Visit (HOSPITAL_COMMUNITY): Payer: Self-pay

## 2024-11-07 ENCOUNTER — Other Ambulatory Visit (HOSPITAL_COMMUNITY): Payer: Self-pay

## 2024-11-07 ENCOUNTER — Telehealth: Admitting: Physician Assistant

## 2024-11-07 DIAGNOSIS — L710 Perioral dermatitis: Secondary | ICD-10-CM

## 2024-11-07 MED ORDER — DOXYCYCLINE HYCLATE 100 MG PO TABS
100.0000 mg | ORAL_TABLET | Freq: Two times a day (BID) | ORAL | 0 refills | Status: AC
  Filled 2024-11-07 (×2): qty 28, 14d supply, fill #0

## 2024-11-07 NOTE — Progress Notes (Signed)
 E Visit for Rash  We are sorry that you are not feeling well. Here is how we plan to help!  Based on what you have shared with me, it seems you are dealing with a perioral dermatitis. I want you to refrain from  using any topical steroids (even OTC like cortisone) on the area as it can make this worse after an initial improvement in symptoms. Keep the skin clean and dry. I am starting you on Doxycycline  100 mg twice daily for 2 weeks. In some cases a longer case of treatment may be needed, so I want you to schedule an in-person follow-up with your PCP.   HOME CARE:  Take cool showers and avoid direct sunlight. Apply cool compress or wet dressings. Take an antihistamine like Benadryl  for widespread rashes that itch.  The adult dose of Benadryl  is 25-50 mg by mouth 4 times daily. Caution:  This type of medication may cause sleepiness.  Do not drink alcohol, drive, or operate dangerous machinery while taking antihistamines.  Do not take these medications if you have prostate enlargement.  Read package instructions thoroughly on all medications that you take.  GET HELP RIGHT AWAY IF:  Symptoms don't go away after treatment. Severe itching that persists. If you rash spreads or swells. If you rash begins to smell. If it blisters and opens or develops a yellow-brown crust. You develop a fever. You have a sore throat. You become short of breath.  MAKE SURE YOU:  Understand these instructions. Will watch your condition. Will get help right away if you are not doing well or get worse.  Thank you for choosing an e-visit. Your e-visit answers were reviewed by a board certified advanced clinical practitioner to complete your personal care plan. Depending upon the condition, your plan could have included both over the counter or prescription medications. Please review your pharmacy choice. Be sure that the pharmacy you have chosen is open so that you can pick up your prescription now.  If there is  a problem you may message your provider in MyChart to have the prescription routed to another pharmacy. Your safety is important to us . If you have drug allergies check your prescription carefully.  For the next 24 hours, you can use MyChart to ask questions about todays visit, request a non-urgent call back, or ask for a work or school excuse from your e-visit provider. You will get an email in the next two days asking about your experience. I hope that your e-visit has been valuable and will speed your recovery.  I have spent 5 minutes in review of e-visit questionnaire, review and updating patient chart, medical decision making and response to patient.   Elsie Velma Lunger, PA-C

## 2024-11-08 ENCOUNTER — Other Ambulatory Visit (HOSPITAL_COMMUNITY): Payer: Self-pay

## 2024-11-18 ENCOUNTER — Ambulatory Visit: Admitting: Family Medicine

## 2024-12-06 ENCOUNTER — Ambulatory Visit: Admitting: Family Medicine

## 2024-12-06 ENCOUNTER — Other Ambulatory Visit (HOSPITAL_COMMUNITY): Payer: Self-pay

## 2024-12-06 ENCOUNTER — Encounter: Payer: Self-pay | Admitting: Family Medicine

## 2024-12-06 ENCOUNTER — Encounter: Payer: Self-pay | Admitting: Pharmacist

## 2024-12-06 VITALS — BP 118/82 | HR 80 | Temp 98.2°F | Ht 69.0 in | Wt 164.0 lb

## 2024-12-06 DIAGNOSIS — D649 Anemia, unspecified: Secondary | ICD-10-CM | POA: Diagnosis not present

## 2024-12-06 DIAGNOSIS — Z23 Encounter for immunization: Secondary | ICD-10-CM

## 2024-12-06 DIAGNOSIS — Z0001 Encounter for general adult medical examination with abnormal findings: Secondary | ICD-10-CM | POA: Diagnosis not present

## 2024-12-06 DIAGNOSIS — N946 Dysmenorrhea, unspecified: Secondary | ICD-10-CM

## 2024-12-06 DIAGNOSIS — G43109 Migraine with aura, not intractable, without status migrainosus: Secondary | ICD-10-CM | POA: Diagnosis not present

## 2024-12-06 DIAGNOSIS — N920 Excessive and frequent menstruation with regular cycle: Secondary | ICD-10-CM | POA: Diagnosis not present

## 2024-12-06 DIAGNOSIS — L710 Perioral dermatitis: Secondary | ICD-10-CM

## 2024-12-06 DIAGNOSIS — Z13220 Encounter for screening for lipoid disorders: Secondary | ICD-10-CM | POA: Diagnosis not present

## 2024-12-06 LAB — POCT LIPID PANEL
HDL: 88
LDL: 81
Non-HDL: 96
TC: 184
TRG: 77

## 2024-12-06 MED ORDER — METRONIDAZOLE 1 % EX GEL
Freq: Every day | CUTANEOUS | 0 refills | Status: AC
  Filled 2024-12-06: qty 60, 30d supply, fill #0

## 2024-12-06 MED ORDER — DROSPIRENONE-ETHINYL ESTRADIOL 3-0.02 MG PO TABS
1.0000 | ORAL_TABLET | Freq: Every day | ORAL | 3 refills | Status: AC
  Filled 2024-12-06: qty 84, 84d supply, fill #0

## 2024-12-06 MED ORDER — NURTEC 75 MG PO TBDP
ORAL_TABLET | ORAL | 0 refills | Status: AC
  Filled 2024-12-06: qty 14, 30d supply, fill #0

## 2024-12-06 MED ORDER — DOXYCYCLINE HYCLATE 50 MG PO CAPS
50.0000 mg | ORAL_CAPSULE | Freq: Every day | ORAL | 3 refills | Status: AC
  Filled 2024-12-06: qty 90, 90d supply, fill #0

## 2024-12-06 NOTE — Patient Instructions (Addendum)
 Start yaz. Stop tranexamic acid .  Start doxycycline  50 mg daily.  Start metronidazole  gel apply small amount daily.  Nurtec prescription for acute ocular migraine sent.  I recommend a 3 month follow up, but if all medicines are doing great, you may wait until physical in one year.    Things to do to keep yourself healthy  - Exercise at least 30-45 minutes a day, 3-4 days a week.  - Eat a low-fat diet with lots of fruits and vegetables, up to 7-9 servings per day.  - Seatbelts can save your life. Wear them always.  - Smoke detectors on every level of your home, check batteries every year.  - Eye Doctor - have an eye exam every 1-2 years  - Alcohol -  If you drink, do it moderately, less than 2 drinks per day.  - Health Care Power of Attorney. Choose someone to speak for you if you are not able.  - Depression is common in our stressful world.If you're feeling down or losing interest in things you normally enjoy, please come in for a visit.  - Violence - If anyone is threatening or hurting you, please call immediately.

## 2024-12-06 NOTE — Assessment & Plan Note (Addendum)
 Check lipids. Looks great.  Recommend continue to work on eating healthy diet and exercise.   Orders:   POCT Lipid Panel

## 2024-12-06 NOTE — Progress Notes (Signed)
 "  Subjective:  Patient ID: Linda Graham, female    DOB: May 22, 1986  Age: 39 y.o. MRN: [id number]  Chief Complaint  Patient presents with   CPE    Discussed the use of AI scribe software for clinical note transcription with the patient, who gave verbal consent to proceed.  History of Present Illness Linda Graham is a 39 year old female who presents with a follow-up for a perioral rash.  Perioral rash - Persistent perioral rash initially diagnosed December 22nd during e-visit - Completed two-week course of doxycycline  with significant improvement, but not complete resolution - Rash is pruritic and flares with menstrual cycle, suspected hormonal influence - Clobetasol  cream provided some relief - Rash worsened in November - Home management with moisturizers, which sometimes exacerbate acne  Menstrual abnormalities - Heavy menstrual bleeding lasting five to six days - Requires frequent bathroom visits during menses - Prescribed tranexamic acid  by gynecologist, used twice with some reduction in bleeding - History of various birth control methods (Seasonique, Nexplanon)  - Nexplanon discontinued due to side effects - Does not wish to use an IUD - Husband has a vasectomy so had not considered restarting OCPs.   Menstrual-associated migraines - Migraines occur particularly during menstrual cycle - Nurtec samples previously effective, but prescription not filled due to cost - Migraines triggered by inconsistent use of distance glasses  Palpitations - Palpitations occur approximately once per week, brief.  - Consumes one cup of coffee daily - Occasionally takes Midol  (contains caffeine) during menstrual cycle - No use of energy drinks or decongestants  General health and additional symptoms - No history of blood clots in herself or family - Does not smoke - Feels safe at home - No chest pain, shortness of breath, bowel or bladder issues - Wears glasses for distance  vision - Takes women's multivitamin and iron pills occasionally  Well Adult Physical: Patient here for a comprehensive physical exam.The patient reports problems - perioral rash and menorrhagia. Do you take any herbs or supplements that were not prescribed by a doctor? no Are you taking calcium supplements? no Are you taking aspirin daily? No MVI.   Encounter for general adult medical examination without abnormal findings  Physical (At Risk items are starred): Patient's last physical exam was 1 year ago .  Patient is not afflicted from Stress Incontinence and Urge Incontinence  Patient wears a seat belts  Patient has smoke detectors and has carbon monoxide detectors. Patient practices appropriate gun safety. Patient wears sunscreen with extended sun exposure. Dental Care: biannual cleanings, brushes and flosses daily. Ophthalmology/Optometry: Annual visit.  Hearing loss: none Vision impairments: distant glasses.      07/10/2022    8:41 AM  Depression screen PHQ 2/9  Decreased Interest 0  Down, Depressed, Hopeless 0  PHQ - 2 Score 0  Altered sleeping 0  Tired, decreased energy 1  Change in appetite 0  Feeling bad or failure about yourself  0  Trouble concentrating 1  Moving slowly or fidgety/restless 1  Suicidal thoughts 0  PHQ-9 Score 3   Difficult doing work/chores Not difficult at all     Data saved with a previous flowsheet row definition     Social Hx   Social History   Socioeconomic History   Marital status: Married    Spouse name: Not on file   Number of children: Not on file   Years of education: Not on file   Highest education level: Not on file  Occupational History  Not on file  Tobacco Use   Smoking status: Never   Smokeless tobacco: Never  Substance and Sexual Activity   Alcohol use: No   Drug use: No   Sexual activity: Yes    Birth control/protection: None  Other Topics Concern   Not on file  Social History Narrative   Not on file   Social  Drivers of Health   Tobacco Use: Low Risk (11/17/2023)   Patient History    Smoking Tobacco Use: Never    Smokeless Tobacco Use: Never    Passive Exposure: Not on file  Financial Resource Strain: Low Risk (12/06/2024)   Overall Financial Resource Strain (CARDIA)    Difficulty of Paying Living Expenses: Not hard at all  Food Insecurity: No Food Insecurity (12/06/2024)   Epic    Worried About Radiation Protection Practitioner of Food in the Last Year: Never true    Ran Out of Food in the Last Year: Never true  Transportation Needs: No Transportation Needs (12/06/2024)   Epic    Lack of Transportation (Medical): No    Lack of Transportation (Non-Medical): No  Physical Activity: Sufficiently Active (12/06/2024)   Exercise Vital Sign    Days of Exercise per Week: 5 days    Minutes of Exercise per Session: 30 min  Stress: No Stress Concern Present (12/06/2024)   [employer] of Occupational Health - Occupational Stress Questionnaire    Feeling of Stress: Not at all  Social Connections: Moderately Integrated (12/06/2024)   Social Connection and Isolation Panel    Frequency of Communication with Friends and Family: More than three times a week    Frequency of Social Gatherings with Friends and Family: More than three times a week    Attends Religious Services: 1 to 4 times per year    Active Member of Clubs or Organizations: No    Attends Banker Meetings: Never    Marital Status: Married  Depression (PHQ2-9): Low Risk (07/10/2022)   Depression (PHQ2-9)    PHQ-2 Score: 3  Alcohol Screen: Low Risk (12/06/2024)   Alcohol Screen    Last Alcohol Screening Score (AUDIT): 2  Housing: Low Risk (12/06/2024)   Epic    Unable to Pay for Housing in the Last Year: No    Number of Times Moved in the Last Year: 0    Homeless in the Last Year: No  Utilities: Not At Risk (12/06/2024)   Epic    Threatened with loss of utilities: No  Health Literacy: Adequate Health Literacy (12/06/2024)   B1300 Health  Literacy    Frequency of need for help with medical instructions: Never   Past Medical History:  Diagnosis Date   Migraine    Past Surgical History:  Procedure Laterality Date   WISDOM TOOTH EXTRACTION      Family History  Problem Relation Age of Onset   Diabetes Mother    Pancreatic cancer Paternal Grandmother    Glaucoma Paternal Grandfather     Review of Systems   Objective:  BP 118/82   Pulse 80   Temp 98.2 F (36.8 C)   Ht 5' 9 (1.753 m)   Wt 164 lb (74.4 kg)   LMP 11/26/2024   SpO2 98%   BMI 24.22 kg/m      12/06/2024    1:50 PM 11/17/2023   10:47 AM 09/24/2022    5:52 PM  BP/Weight  Systolic BP 118 102 129  Diastolic BP 82 70 62  Wt. (Lbs) 164 157.2  BMI 24.22 kg/m2 23.21 kg/m2     Physical Exam Vitals reviewed.  Constitutional:      General: She is not in acute distress.    Appearance: Normal appearance. She is normal weight.  HENT:     Right Ear: Tympanic membrane and ear canal normal.     Left Ear: Tympanic membrane and ear canal normal.     Nose: Nose normal. No congestion or rhinorrhea.  Eyes:     Conjunctiva/sclera: Conjunctivae normal.  Neck:     Thyroid : No thyroid  mass.     Vascular: No carotid bruit.  Cardiovascular:     Rate and Rhythm: Normal rate and regular rhythm.     Pulses: Normal pulses.     Heart sounds: Normal heart sounds. No murmur heard. Pulmonary:     Effort: Pulmonary effort is normal. No respiratory distress.     Breath sounds: Normal breath sounds.  Abdominal:     General: Abdomen is flat. Bowel sounds are normal.     Palpations: Abdomen is soft. There is no mass.     Tenderness: There is no abdominal tenderness.  Musculoskeletal:        General: Normal range of motion.  Lymphadenopathy:     Cervical: No cervical adenopathy.  Skin:    General: Skin is warm and dry.     Findings: Rash (perioral acne vulgaris/rosacea) present.  Neurological:     Mental Status: She is alert and oriented to person, place, and  time.     Cranial Nerves: No cranial nerve deficit.  Psychiatric:        Mood and Affect: Mood normal.        Behavior: Behavior normal.     Lab Results  Component Value Date   WBC 5.7 07/10/2022   HGB 13.2 07/10/2022   HCT 39.1 07/10/2022   PLT 292 07/10/2022   GLUCOSE 81 07/10/2022   CHOL 175 07/10/2022   TRIG 35 07/10/2022   HDL 82 07/10/2022   LDLCALC 85 07/10/2022   ALT 11 07/10/2022   AST 13 07/10/2022   NA 141 07/10/2022   K 4.3 07/10/2022   CL 102 07/10/2022   CREATININE 0.61 07/10/2022   BUN 10 07/10/2022   CO2 26 07/10/2022   TSH 1.870 07/10/2022      Assessment & Plan:   Assessment & Plan Encounter for routine adult health examination with abnormal findings Check labs.  Recommend continue to work on eating healthy diet and exercise.  Orders:   CBC with Differential/Platelet   Comprehensive metabolic panel with GFR   TSH  Ocular migraine Nurtec as needed. Triptans contraindicated.  Orders:   Rimegepant Sulfate  (NURTEC) 75 MG TBDP; Dissolve 1 tablet by mouth once at onset of migraine.  Encounter for immunization  Orders:   Tdap vaccine greater than or equal to 7yo IM   Pfizer Comirnaty Covid-19 Vaccine 36yrs & older  Screening cholesterol level Check lipids. Looks great.  Recommend continue to work on eating healthy diet and exercise.   Orders:   POCT Lipid Panel  Menorrhagia with regular cycle Chronic heavy bleeding managed with tranexamic acid . No fibroids or clotting history.  - start yaz. Discontinue tranexamic acid .      Dysmenorrhea - Start yaz.  Orders:   drospirenone -ethinyl estradiol  (YAZ) 3-0.02 MG tablet; Take 1 tablet by mouth daily.  Perioral dermatitis Chronic condition improved with doxycycline  but not resolved. Symptoms include dryness, itching, and acne-like breakouts, possibly hormonal. - Continue clobetasol  cream for symptomatic relief. Orders:  doxycycline  (VIBRAMYCIN ) 50 MG capsule; Take 1 capsule (50 mg  total) by mouth daily.   metroNIDAZOLE  (METROGEL ) 1 % gel; Apply topically daily. Small amount to affected area  Anemia, normocytic normochromic Hb came back low. Added on labs below.  Orders:   B12 and Folate Panel   Iron   Specimen status report     Body mass index is 24.22 kg/m.      This is a list of the screening recommended for you and due dates:  Health Maintenance  Topic Date Due   Hepatitis C Screening  Never done   DTaP/Tdap/Td vaccine (1 - Tdap) Never done   Hepatitis B Vaccine (1 of 3 - 19+ 3-dose series) Never done   COVID-19 Vaccine (4 - 2025-26 season) 07/18/2024   Pap with HPV screening  07/29/2027   Flu Shot  Completed   HPV Vaccine (No Doses Required) Completed   HIV Screening  Completed   Pneumococcal Vaccine  Aged Out   Meningitis B Vaccine  Aged Out     No orders of the defined types were placed in this encounter.   Follow-up: No follow-ups on file.  An After Visit Summary was printed and given to the patient.  Abigail Free, MD [facility] [phone]  "

## 2024-12-06 NOTE — Assessment & Plan Note (Addendum)
 Nurtec as needed. Triptans contraindicated.  Orders:   Rimegepant Sulfate  (NURTEC) 75 MG TBDP; Dissolve 1 tablet by mouth once at onset of migraine.

## 2024-12-07 ENCOUNTER — Ambulatory Visit: Payer: Self-pay | Admitting: Family Medicine

## 2024-12-07 ENCOUNTER — Other Ambulatory Visit: Payer: Self-pay | Admitting: Medical Genetics

## 2024-12-07 DIAGNOSIS — D649 Anemia, unspecified: Secondary | ICD-10-CM

## 2024-12-07 LAB — COMPREHENSIVE METABOLIC PANEL WITH GFR
ALT: 14 IU/L (ref 0–32)
AST: 17 IU/L (ref 0–40)
Albumin: 4.4 g/dL (ref 3.9–4.9)
Alkaline Phosphatase: 45 IU/L (ref 41–116)
BUN/Creatinine Ratio: 20 (ref 9–23)
BUN: 11 mg/dL (ref 6–20)
Bilirubin Total: 0.3 mg/dL (ref 0.0–1.2)
CO2: 23 mmol/L (ref 20–29)
Calcium: 9.3 mg/dL (ref 8.7–10.2)
Chloride: 103 mmol/L (ref 96–106)
Creatinine, Ser: 0.56 mg/dL — ABNORMAL LOW (ref 0.57–1.00)
Globulin, Total: 2 g/dL (ref 1.5–4.5)
Glucose: 80 mg/dL (ref 70–99)
Potassium: 3.9 mmol/L (ref 3.5–5.2)
Sodium: 141 mmol/L (ref 134–144)
Total Protein: 6.4 g/dL (ref 6.0–8.5)
eGFR: 119 mL/min/1.73

## 2024-12-07 LAB — CBC WITH DIFFERENTIAL/PLATELET
Basophils Absolute: 0.1 x10E3/uL (ref 0.0–0.2)
Basos: 1 %
EOS (ABSOLUTE): 0.1 x10E3/uL (ref 0.0–0.4)
Eos: 2 %
Hematocrit: 33.3 % — ABNORMAL LOW (ref 34.0–46.6)
Hemoglobin: 10.8 g/dL — ABNORMAL LOW (ref 11.1–15.9)
Immature Grans (Abs): 0 x10E3/uL (ref 0.0–0.1)
Immature Granulocytes: 0 %
Lymphocytes Absolute: 1.4 x10E3/uL (ref 0.7–3.1)
Lymphs: 29 %
MCH: 28.2 pg (ref 26.6–33.0)
MCHC: 32.4 g/dL (ref 31.5–35.7)
MCV: 87 fL (ref 79–97)
Monocytes Absolute: 0.3 x10E3/uL (ref 0.1–0.9)
Monocytes: 6 %
Neutrophils Absolute: 3 x10E3/uL (ref 1.4–7.0)
Neutrophils: 62 %
Platelets: 293 x10E3/uL (ref 150–450)
RBC: 3.83 x10E6/uL (ref 3.77–5.28)
RDW: 14 % (ref 11.7–15.4)
WBC: 5 x10E3/uL (ref 3.4–10.8)

## 2024-12-07 LAB — TSH: TSH: 1.9 u[IU]/mL (ref 0.450–4.500)

## 2024-12-10 LAB — IRON: Iron: 37 ug/dL (ref 27–159)

## 2024-12-10 LAB — B12 AND FOLATE PANEL
Folate: >20 ng/mL
Vitamin B-12: 539 pg/mL (ref 232–1245)

## 2024-12-10 LAB — SPECIMEN STATUS REPORT

## 2024-12-11 DIAGNOSIS — Z23 Encounter for immunization: Secondary | ICD-10-CM | POA: Insufficient documentation

## 2024-12-11 DIAGNOSIS — L710 Perioral dermatitis: Secondary | ICD-10-CM | POA: Insufficient documentation

## 2024-12-11 DIAGNOSIS — N920 Excessive and frequent menstruation with regular cycle: Secondary | ICD-10-CM | POA: Insufficient documentation

## 2024-12-11 DIAGNOSIS — Z0001 Encounter for general adult medical examination with abnormal findings: Secondary | ICD-10-CM | POA: Insufficient documentation

## 2024-12-11 DIAGNOSIS — N946 Dysmenorrhea, unspecified: Secondary | ICD-10-CM | POA: Insufficient documentation

## 2024-12-11 NOTE — Assessment & Plan Note (Signed)
 Chronic condition improved with doxycycline  but not resolved. Symptoms include dryness, itching, and acne-like breakouts, possibly hormonal. - Continue clobetasol  cream for symptomatic relief. Orders:   doxycycline  (VIBRAMYCIN ) 50 MG capsule; Take 1 capsule (50 mg total) by mouth daily.   metroNIDAZOLE  (METROGEL ) 1 % gel; Apply topically daily. Small amount to affected area

## 2024-12-11 NOTE — Assessment & Plan Note (Addendum)
" °  Orders:   Tdap vaccine greater than or equal to 40yo IM   Pfizer Comirnaty Covid-19 Vaccine 78yrs & older  "

## 2024-12-11 NOTE — Assessment & Plan Note (Signed)
-   Start yaz.  Orders:   drospirenone -ethinyl estradiol  (YAZ) 3-0.02 MG tablet; Take 1 tablet by mouth daily.

## 2024-12-11 NOTE — Assessment & Plan Note (Signed)
 Chronic heavy bleeding managed with tranexamic acid . No fibroids or clotting history.  - start yaz. Discontinue tranexamic acid .

## 2024-12-11 NOTE — Assessment & Plan Note (Addendum)
 Check labs.  Recommend continue to work on eating healthy diet and exercise.  Orders:   CBC with Differential/Platelet   Comprehensive metabolic panel with GFR   TSH

## 2025-03-07 ENCOUNTER — Ambulatory Visit: Admitting: Family Medicine
